# Patient Record
Sex: Female | Born: 1945 | Hispanic: No | Smoking: Never smoker
Health system: Southern US, Community
[De-identification: ages and names within clinical notes are randomized; demographics above are authoritative.]

## PROBLEM LIST (undated history)

## (undated) DIAGNOSIS — H3530 Unspecified macular degeneration: Secondary | ICD-10-CM

## (undated) DIAGNOSIS — R51 Headache: Secondary | ICD-10-CM

## (undated) DIAGNOSIS — M1990 Unspecified osteoarthritis, unspecified site: Secondary | ICD-10-CM

## (undated) DIAGNOSIS — R519 Headache, unspecified: Secondary | ICD-10-CM

## (undated) DIAGNOSIS — K219 Gastro-esophageal reflux disease without esophagitis: Secondary | ICD-10-CM

## (undated) HISTORY — PX: CATARACT EXTRACTION, BILATERAL: SHX1313

## (undated) HISTORY — PX: KNEE ARTHROSCOPY: SUR90

## (undated) HISTORY — PX: COLONOSCOPY: SHX174

## (undated) HISTORY — DX: Unspecified osteoarthritis, unspecified site: M19.90

## (undated) HISTORY — DX: Unspecified macular degeneration: H35.30

## (undated) HISTORY — PX: JOINT REPLACEMENT: SHX530

---

## 1981-03-16 HISTORY — PX: TUBAL LIGATION: SHX77

## 2011-12-16 ENCOUNTER — Other Ambulatory Visit (HOSPITAL_BASED_OUTPATIENT_CLINIC_OR_DEPARTMENT_OTHER): Payer: Self-pay | Admitting: Orthopaedic Surgery

## 2011-12-16 DIAGNOSIS — R52 Pain, unspecified: Secondary | ICD-10-CM

## 2011-12-16 DIAGNOSIS — R531 Weakness: Secondary | ICD-10-CM

## 2011-12-19 ENCOUNTER — Ambulatory Visit (HOSPITAL_BASED_OUTPATIENT_CLINIC_OR_DEPARTMENT_OTHER)
Admission: RE | Admit: 2011-12-19 | Discharge: 2011-12-19 | Disposition: A | Discharge status: Home / Self Care | Payer: Medicare Other | Source: Ambulatory Visit | Attending: Orthopaedic Surgery | Admitting: Orthopaedic Surgery

## 2011-12-19 DIAGNOSIS — X58XXXA Exposure to other specified factors, initial encounter: Secondary | ICD-10-CM | POA: Insufficient documentation

## 2011-12-19 DIAGNOSIS — M1710 Unilateral primary osteoarthritis, unspecified knee: Secondary | ICD-10-CM | POA: Insufficient documentation

## 2011-12-19 DIAGNOSIS — IMO0002 Reserved for concepts with insufficient information to code with codable children: Secondary | ICD-10-CM | POA: Insufficient documentation

## 2011-12-19 DIAGNOSIS — M6740 Ganglion, unspecified site: Secondary | ICD-10-CM | POA: Insufficient documentation

## 2011-12-19 DIAGNOSIS — R531 Weakness: Secondary | ICD-10-CM

## 2011-12-19 DIAGNOSIS — R52 Pain, unspecified: Secondary | ICD-10-CM

## 2013-10-11 ENCOUNTER — Ambulatory Visit: Payer: Medicare Other | Attending: Orthopaedic Surgery | Admitting: Rehabilitation

## 2013-10-11 DIAGNOSIS — R609 Edema, unspecified: Secondary | ICD-10-CM | POA: Insufficient documentation

## 2013-10-11 DIAGNOSIS — M25569 Pain in unspecified knee: Secondary | ICD-10-CM | POA: Diagnosis not present

## 2013-10-11 DIAGNOSIS — Z9889 Other specified postprocedural states: Secondary | ICD-10-CM | POA: Insufficient documentation

## 2013-10-11 DIAGNOSIS — IMO0001 Reserved for inherently not codable concepts without codable children: Secondary | ICD-10-CM | POA: Diagnosis present

## 2013-10-12 ENCOUNTER — Ambulatory Visit: Payer: Medicare Other | Admitting: Rehabilitation

## 2013-10-12 DIAGNOSIS — IMO0001 Reserved for inherently not codable concepts without codable children: Secondary | ICD-10-CM | POA: Diagnosis not present

## 2013-10-16 ENCOUNTER — Ambulatory Visit: Payer: Medicare Other | Attending: Orthopaedic Surgery | Admitting: Rehabilitation

## 2013-10-16 DIAGNOSIS — IMO0001 Reserved for inherently not codable concepts without codable children: Secondary | ICD-10-CM | POA: Insufficient documentation

## 2013-10-16 DIAGNOSIS — Z9889 Other specified postprocedural states: Secondary | ICD-10-CM | POA: Insufficient documentation

## 2013-10-16 DIAGNOSIS — R609 Edema, unspecified: Secondary | ICD-10-CM | POA: Diagnosis not present

## 2013-10-16 DIAGNOSIS — M25569 Pain in unspecified knee: Secondary | ICD-10-CM | POA: Diagnosis not present

## 2013-10-19 ENCOUNTER — Ambulatory Visit: Payer: Medicare Other | Admitting: Rehabilitation

## 2013-10-19 DIAGNOSIS — IMO0001 Reserved for inherently not codable concepts without codable children: Secondary | ICD-10-CM | POA: Diagnosis not present

## 2013-10-23 ENCOUNTER — Ambulatory Visit: Payer: Medicare Other | Admitting: Rehabilitation

## 2013-10-23 DIAGNOSIS — IMO0001 Reserved for inherently not codable concepts without codable children: Secondary | ICD-10-CM | POA: Diagnosis not present

## 2013-10-26 ENCOUNTER — Ambulatory Visit: Payer: Medicare Other | Admitting: Rehabilitation

## 2013-10-26 DIAGNOSIS — IMO0001 Reserved for inherently not codable concepts without codable children: Secondary | ICD-10-CM | POA: Diagnosis not present

## 2013-10-30 ENCOUNTER — Ambulatory Visit: Payer: Medicare Other | Admitting: Rehabilitation

## 2013-10-30 DIAGNOSIS — IMO0001 Reserved for inherently not codable concepts without codable children: Secondary | ICD-10-CM | POA: Diagnosis not present

## 2013-11-02 ENCOUNTER — Ambulatory Visit: Payer: Medicare Other | Admitting: Rehabilitation

## 2013-11-02 DIAGNOSIS — IMO0001 Reserved for inherently not codable concepts without codable children: Secondary | ICD-10-CM | POA: Diagnosis not present

## 2013-11-06 ENCOUNTER — Ambulatory Visit: Payer: Medicare Other | Admitting: Rehabilitation

## 2013-11-06 DIAGNOSIS — IMO0001 Reserved for inherently not codable concepts without codable children: Secondary | ICD-10-CM | POA: Diagnosis not present

## 2013-11-13 ENCOUNTER — Encounter: Payer: Medicare Other | Admitting: Rehabilitation

## 2013-11-14 ENCOUNTER — Ambulatory Visit: Payer: Medicare Other | Attending: Orthopaedic Surgery | Admitting: Rehabilitation

## 2013-11-14 DIAGNOSIS — IMO0001 Reserved for inherently not codable concepts without codable children: Secondary | ICD-10-CM | POA: Insufficient documentation

## 2013-11-14 DIAGNOSIS — Z9889 Other specified postprocedural states: Secondary | ICD-10-CM | POA: Insufficient documentation

## 2013-11-14 DIAGNOSIS — M25569 Pain in unspecified knee: Secondary | ICD-10-CM | POA: Diagnosis not present

## 2013-11-14 DIAGNOSIS — R609 Edema, unspecified: Secondary | ICD-10-CM | POA: Insufficient documentation

## 2016-10-28 ENCOUNTER — Ambulatory Visit (INDEPENDENT_AMBULATORY_CARE_PROVIDER_SITE_OTHER): Payer: Medicare Other

## 2016-10-28 ENCOUNTER — Ambulatory Visit (INDEPENDENT_AMBULATORY_CARE_PROVIDER_SITE_OTHER): Payer: Self-pay

## 2016-10-28 ENCOUNTER — Encounter (INDEPENDENT_AMBULATORY_CARE_PROVIDER_SITE_OTHER): Payer: Self-pay | Admitting: Orthopedic Surgery

## 2016-10-28 ENCOUNTER — Ambulatory Visit (INDEPENDENT_AMBULATORY_CARE_PROVIDER_SITE_OTHER): Payer: Medicare Other | Admitting: Orthopedic Surgery

## 2016-10-28 VITALS — BP 114/64 | HR 87 | Ht 66.0 in | Wt 165.0 lb

## 2016-10-28 DIAGNOSIS — M1711 Unilateral primary osteoarthritis, right knee: Secondary | ICD-10-CM | POA: Diagnosis not present

## 2016-10-28 DIAGNOSIS — M1712 Unilateral primary osteoarthritis, left knee: Secondary | ICD-10-CM

## 2016-10-28 DIAGNOSIS — M170 Bilateral primary osteoarthritis of knee: Secondary | ICD-10-CM

## 2016-10-28 MED ORDER — BUPIVACAINE HCL 0.5 % IJ SOLN
3.0000 mL | INTRAMUSCULAR | Status: AC | PRN
  Administered 2016-10-28: 3 mL via INTRA_ARTICULAR

## 2016-10-28 MED ORDER — LIDOCAINE HCL 1 % IJ SOLN
3.0000 mL | INTRAMUSCULAR | Status: AC | PRN
  Administered 2016-10-28: 3 mL

## 2016-10-28 MED ORDER — METHYLPREDNISOLONE ACETATE 40 MG/ML IJ SUSP
80.0000 mg | INTRAMUSCULAR | Status: AC | PRN
  Administered 2016-10-28: 80 mg

## 2016-10-28 NOTE — Progress Notes (Signed)
Office Visit Note   Patient: Stacey Bryant           Date of Birth: [birth year]           MRN: [id number] Visit Date: 10/28/2016              Requested by: Raynelle Jan., MD [address] [place], [place] [address] PCP: Raynelle Jan., MD   Assessment & Plan: Visit Diagnoses:  1. Bilateral primary osteoarthritis of knee     Plan:  #1: Corticosteroid injection was given to the right knee. Tolerated procedure well. #2: Follow back up prior to her trip to [place] for the corticosteroid injection to the left knee which is more symptomatic.  Follow-Up Instructions: Return in about 3 weeks (around 11/18/2016).   Orders:  Orders Placed This Encounter  Procedures  . Large Joint Injection/Arthrocentesis  . XR Knee Complete 4 Views Left  . XR Knee Complete 4 Views Right   No orders of the defined types were placed in this encounter.     Procedures: Large Joint Inj Date/Time: 10/28/2016 11:18 AM Performed by: Jacqualine Code D Authorized by: Jacqualine Code D   Consent Given by:  Patient Timeout: prior to procedure the correct patient, procedure, and site was verified   Indications:  Pain and joint swelling Location:  Knee Site:  R knee Prep: patient was prepped and draped in usual sterile fashion   Needle Size:  25 G Needle Length:  1.5 inches Approach:  Anteromedial Ultrasound Guidance: No   Fluoroscopic Guidance: No   Arthrogram: No   Medications:  80 mg methylPREDNISolone acetate 40 MG/ML; 3 mL bupivacaine 0.5 %; 3 mL lidocaine 1 % Aspiration Attempted: No   Patient tolerance:  Patient tolerated the procedure well with no immediate complications     Clinical Data: No additional findings.   Subjective: Chief Complaint  Patient presents with  . Right Knee - Pain    Says that it is the same Erin Hearing' thing that is always going on.  . Left Knee - Pain    Jonny Ruiz is a very pleasant 71 year old white female who is seen today for evaluation of both knees her pain is  the knees have been previously noted because of osteoarthritis. Her last visit was in June 2016 after series of 3 Euflexa injections to the left knee. She states she is actually done well with that however most recently though she is starting to have pain and discomfort in the knees left much greater than right. Her symptoms are a global and the more painful along the lateral aspect and posteriorly on the left. The right is much less painful and somewhat tolerable this time. She has a trip to [place] and to [place] for the next several months. She comes in today requesting corticosteroid injection.    Review of Systems  All other systems reviewed and are negative.    Objective: Vital Signs: BP 114/64 (BP Location: Left Arm, Patient Position: Sitting)   Pulse 87   Ht 5\' 6"  (1.676 m)   Wt 165 lb (74.8 kg)   BMI 26.63 kg/m   Physical Exam  Constitutional: She is oriented to person, place, and time. She appears well-developed and well-nourished.  HENT:  Head: Normocephalic and atraumatic.  Eyes: Pupils are equal, round, and reactive to light. EOM are normal.  Pulmonary/Chest: Effort normal.  Neurological: She is alert and oriented to person, place, and time.  Skin: Skin is warm and dry.  Psychiatric: She  has a normal mood and affect. Her behavior is normal. Judgment and thought content normal.    Ortho Exam  Today she has range of motion of both knees little less than full extension with flexion to about 120. She does have some patellofemoral crepitance bilaterally. Left knee is tender to palpation along the joint lines as well as in the posterior aspect of the knee. She does have a trace effusion noted. Right knee is much less tender to palpation. Trace effusion noted also. Bilateral valgus positioning of the knee.  Specialty Comments:  No specialty comments available.  Imaging: Xr Knee Complete 4 Views Left  Result Date: 10/28/2016 Three-view x-rays of the left knee reveals  joint space narrowing on the medial compartment. She still maintaining a joint space. She does have some para-articular spurring medially but these are small in comparison to the right. Lateral joint space is open but also has periarticular spurring noted. She does have patellofemoral marked degeneration of that joint. Some calcification superior to the patella on the lateral.  Xr Knee Complete 4 Views Right  Result Date: 10/28/2016 Three-view x-ray of the right knee reveals marked degenerative changes more lateral than medial. She has periarticular spurring certainly more on the lateral aspect of the tibia and femur. She also has changes noted in the medial compartment. Possible cyst in the intercondylar eminence area she does have significant patellofemoral right is also. Very large spurs on the sunrise view.    PMFS History: There are no active problems to display for this patient.  No past medical history on file.  No family history on file.  No past surgical history on file. Social History   Occupational History  . Not on file.   Social History Main Topics  . Smoking status: Never Smoker  . Smokeless tobacco: Never Used  . Alcohol use Not on file  . Drug use: Unknown  . Sexual activity: Not on file

## 2016-11-18 ENCOUNTER — Encounter (INDEPENDENT_AMBULATORY_CARE_PROVIDER_SITE_OTHER): Payer: Self-pay | Admitting: Orthopedic Surgery

## 2016-11-18 ENCOUNTER — Ambulatory Visit (INDEPENDENT_AMBULATORY_CARE_PROVIDER_SITE_OTHER): Payer: Medicare Other | Admitting: Orthopedic Surgery

## 2016-11-18 VITALS — BP 123/67 | HR 71 | Resp 14 | Ht 66.0 in | Wt 165.0 lb

## 2016-11-18 DIAGNOSIS — M1712 Unilateral primary osteoarthritis, left knee: Secondary | ICD-10-CM | POA: Diagnosis not present

## 2016-11-18 MED ORDER — BUPIVACAINE HCL 0.5 % IJ SOLN
3.0000 mL | INTRAMUSCULAR | Status: AC | PRN
  Administered 2016-11-18: 3 mL via INTRA_ARTICULAR

## 2016-11-18 MED ORDER — TRAMADOL HCL 50 MG PO TABS: 50.0000 mg | ORAL_TABLET | Freq: Four times a day (QID) | ORAL | 0 refills | Status: DC | PRN

## 2016-11-18 MED ORDER — METHYLPREDNISOLONE ACETATE 40 MG/ML IJ SUSP
80.0000 mg | INTRAMUSCULAR | Status: AC | PRN
  Administered 2016-11-18: 80 mg

## 2016-11-18 MED ORDER — LIDOCAINE HCL 1 % IJ SOLN
3.0000 mL | INTRAMUSCULAR | Status: AC | PRN
  Administered 2016-11-18: 3 mL

## 2016-11-18 NOTE — Progress Notes (Signed)
Office Visit Note   Patient: Stacey Bryant           Date of Birth: March 15, 1946           MRN: [id number] Visit Date: 11/18/2016              Requested by: Raynelle Jan., MD [address] [place], [place] [address] PCP: Raynelle Jan., MD   Assessment & Plan: Visit Diagnoses:  1. Unilateral primary osteoarthritis, left knee     Plan:  #1: Corticosteroid injection to the left knee #2: Tramadol prescription was written for her to take with her to [place] just in case she needs it.  Follow-Up Instructions: Return if symptoms worsen or fail to improve.   Orders:  No orders of the defined types were placed in this encounter.  Meds ordered this encounter  Medications  . traMADol (ULTRAM) 50 MG tablet    Sig: Take 1 tablet (50 mg total) by mouth every 6 (six) hours as needed.    Dispense:  20 tablet    Refill:  0    Order Specific Question:   Supervising Provider    Answer:   Valeria Batman [[id number]]      Procedures: Large Joint Inj Date/Time: 11/18/2016 11:04 AM Performed by: Jacqualine Code D Authorized by: Jacqualine Code D   Consent Given by:  Patient Timeout: prior to procedure the correct patient, procedure, and site was verified   Indications:  Pain and joint swelling Location:  Knee Site:  L knee Prep: patient was prepped and draped in usual sterile fashion   Needle Size:  25 G Needle Length:  1.5 inches Approach:  Anteromedial Ultrasound Guidance: No   Fluoroscopic Guidance: No   Arthrogram: No   Medications:  80 mg methylPREDNISolone acetate 40 MG/ML; 3 mL bupivacaine 0.5 %; 3 mL lidocaine 1 % Aspiration Attempted: No   Patient tolerance:  Patient tolerated the procedure well with no immediate complications     Clinical Data: No additional findings.   Subjective: Chief Complaint  Patient presents with  . Left Knee - Pain  . Knee Pain    Left knee pain x years, swelling more with activity, difficulty with stairs, popping, clicking, grinding  noises, arthroscopic surgery 2015 Dr. Cleophas Dunker, not diabetic    Stacey Bryant is a very pleasant 72 year old white female who is seen today for evaluation and follow-up of both knees. She has been noted previously evaluated for osteoarthritis of both knees. She was seen in June 2016 after series of 3 Euflexa injections to the left knee. She states she is actually done well with that however most recently though she is starting to have pain and discomfort in the knees and that time the left was much greater than right. Her symptoms are global and the more painful along the lateral aspect and posteriorly on the left. The right is much less painful and somewhat tolerable this time. She has a trip to [place] and to [place] for the next several months. I had previously injected the right knee and she comes in now for the left knee to be injected prior to her trip.    Review of Systems  All other systems reviewed and are negative.    Objective: Vital Signs: BP 123/67 (BP Location: Left Arm, Patient Position: Sitting, Cuff Size: Normal)   Pulse 71   Resp 14   Ht 5\' 6"  (1.676 m)   Wt 165 lb (74.8 kg)   BMI 26.63 kg/m  Physical Exam  Constitutional: She is oriented to person, place, and time. She appears well-developed and well-nourished.  HENT:  Head: Normocephalic and atraumatic.  Eyes: Pupils are equal, round, and reactive to light. EOM are normal.  Pulmonary/Chest: Effort normal.  Neurological: She is alert and oriented to person, place, and time.  Skin: Skin is warm and dry.  Psychiatric: She has a normal mood and affect. Her behavior is normal. Judgment and thought content normal.    Ortho Exam  Today she has range of motion of both knees little less than full extension with flexion to about 120. She does have some patellofemoral crepitance bilaterally. Left knee is tender to palpation along the joint lines as well as in the posterior aspect of the knee. She does have a trace effusion  noted. Right knee is benign at this time.  Specialty Comments:  No specialty comments available.  Imaging: No results found.   PMFS History: There are no active problems to display for this patient.  Past Medical History:  Diagnosis Date  . Arthritis     History reviewed. No pertinent family history.  Past Surgical History:  Procedure Laterality Date  . KNEE ARTHROSCOPY     Social History   Occupational History  . Not on file.   Social History Main Topics  . Smoking status: Never Smoker  . Smokeless tobacco: Never Used  . Alcohol use 0.6 oz/week    1 Glasses of wine per week  . Drug use: No  . Sexual activity: Not on file

## 2017-01-05 ENCOUNTER — Encounter (INDEPENDENT_AMBULATORY_CARE_PROVIDER_SITE_OTHER): Payer: Self-pay | Admitting: Orthopaedic Surgery

## 2017-01-05 ENCOUNTER — Ambulatory Visit (INDEPENDENT_AMBULATORY_CARE_PROVIDER_SITE_OTHER): Payer: Medicare Other | Admitting: Orthopaedic Surgery

## 2017-01-05 VITALS — BP 130/67 | HR 70 | Resp 14 | Ht 68.0 in | Wt 165.0 lb

## 2017-01-05 DIAGNOSIS — M170 Bilateral primary osteoarthritis of knee: Secondary | ICD-10-CM | POA: Diagnosis not present

## 2017-01-05 NOTE — Progress Notes (Signed)
Office Visit Note   Patient: Stacey Bryant           Date of Birth: [birth year]           MRN: [id number] Visit Date: 01/05/2017              Requested by: Raynelle JanSpry, Heather M., MD [address] [place], [place] [address] PCP: Raynelle JanSpry, Heather M., MD   Assessment & Plan: Visit Diagnoses:  1. Bilateral primary osteoarthritis of knee     Plan: Long discussion regarding treatment options. Stacey Bryant would like to proceed with a left total knee replacement. We discussed over about 45 minutes the surgery, rehabilitation, physical therapy and tensor complications. She lives alone she would need postop inpatient rehabilitation we have [place] [facility] [place]. She'll need a clearance form from her primary care physician, Dr. Carolyne FiscalSpry. She would like to consider surgery sometime in early January  Follow-Up Instructions: Return will schedule surgery left knee.   Orders:  No orders of the defined types were placed in this encounter.  No orders of the defined types were placed in this encounter.     Procedures: No procedures performed   Clinical Data: No additional findings.   Subjective: Chief Complaint  Patient presents with  . Left Knee - Pain, Edema    Stacey Bryant is a 71 y o that is here  For chronic Left knee pain. Left>right. Today she would liek to discuss surgery. She did a lot of walking on a trip to [place].  Has been having problem with the arthritis in both of her knees for "many years". She just recently was on a trip to [place] when she had an exacerbation of her pain or realizes that it's "time to have my knee replacement. She's had cortisone in the past. She has tried anti-inflammatory medicines. She's reached a point where the pain interferes with her activities of daily living typically with her left knee. Since of both of her knees were performed in the standing projection in August. She does have significant decrease in the medial compartment of her  left knee with increased varus, subchondral sclerosis medially and peripheral osteophytes cerebral osteophyte formation about the patellofemoral joint. Both knees films are consistent with end-stage osteoarthritis. She certainly more symptomatic on the left  HPI  Review of Systems  Constitutional: Negative for chills, fatigue and fever.  Eyes: Negative for itching.  Respiratory: Negative for chest tightness and shortness of breath.   Cardiovascular: Negative for chest pain, palpitations and leg swelling.  Gastrointestinal: Negative for blood in stool, constipation and diarrhea.  Endocrine: Negative for polyuria.  Genitourinary: Negative for dysuria.  Musculoskeletal: Positive for arthralgias. Negative for back pain, joint swelling, neck pain and neck stiffness.  Allergic/Immunologic: Negative for immunocompromised state.  Neurological: Negative for dizziness and numbness.  Hematological: Does not bruise/bleed easily.  Psychiatric/Behavioral: Positive for sleep disturbance. The patient is not nervous/anxious.      Objective: Vital Signs: BP 130/67   Pulse 70   Resp 14   Ht 5\' 8"  (1.727 m)   Wt 165 lb (74.8 kg)   BMI 25.09 kg/m   Physical Exam  Ortho Exam awake alert and oriented 3 comfortable sitting. Very pleasant. Lacks a few degrees to full left knee extension. Some hypertrophic changes around the knee with a very minimal effusion. Left knee was not hot red warm or swollen. Flexed over 110 without instability. No calf or popliteal pain. Good pulses distally no swelling  distally. Straight leg raise negative. Painless range of motion both hips  Specialty Comments:  No specialty comments available.  Imaging: No results found.   PMFS History: There are no active problems to display for this patient.  Past Medical History:  Diagnosis Date  . Arthritis     No family history on file.  Past Surgical History:  Procedure Laterality Date  . KNEE ARTHROSCOPY     Social  History   Occupational History  . Not on file.   Social History Main Topics  . Smoking status: Never Smoker  . Smokeless tobacco: Never Used  . Alcohol use 0.6 oz/week    1 Glasses of wine per week  . Drug use: No  . Sexual activity: Not on file

## 2017-03-17 ENCOUNTER — Ambulatory Visit (INDEPENDENT_AMBULATORY_CARE_PROVIDER_SITE_OTHER): Payer: Medicare Other

## 2017-03-17 ENCOUNTER — Encounter (INDEPENDENT_AMBULATORY_CARE_PROVIDER_SITE_OTHER): Payer: Self-pay | Admitting: Orthopedic Surgery

## 2017-03-17 ENCOUNTER — Ambulatory Visit (INDEPENDENT_AMBULATORY_CARE_PROVIDER_SITE_OTHER): Payer: Self-pay

## 2017-03-17 ENCOUNTER — Ambulatory Visit (INDEPENDENT_AMBULATORY_CARE_PROVIDER_SITE_OTHER): Payer: Medicare Other | Admitting: Orthopedic Surgery

## 2017-03-17 VITALS — BP 118/67 | HR 84 | Temp 97.8°F | Resp 16 | Ht 65.5 in | Wt 173.0 lb

## 2017-03-17 DIAGNOSIS — M1611 Unilateral primary osteoarthritis, right hip: Secondary | ICD-10-CM | POA: Diagnosis not present

## 2017-03-17 DIAGNOSIS — M25551 Pain in right hip: Secondary | ICD-10-CM | POA: Insufficient documentation

## 2017-03-17 DIAGNOSIS — M1712 Unilateral primary osteoarthritis, left knee: Secondary | ICD-10-CM | POA: Diagnosis not present

## 2017-03-17 NOTE — Progress Notes (Deleted)
   Office Visit Note   Patient: Stacey Bryant           Date of Birth: January 01, 1946           MRN: [id number] Visit Date: 03/17/2017              Requested by: Raynelle JanSpry, Heather M., MD [address] [place], [place] [address] PCP: Raynelle JanSpry, Heather M., MD   Assessment & Plan: Visit Diagnoses: No diagnosis found.  Plan: ***  Follow-Up Instructions: No Follow-up on file.   Orders:  No orders of the defined types were placed in this encounter.  No orders of the defined types were placed in this encounter.     Procedures: No procedures performed   Clinical Data: No additional findings.   Subjective: Chief Complaint  Patient presents with  . Left Knee - Pain  . Pre-op Exam    03/30/17 Left total knee replacment    HPI  Review of Systems  Constitutional: Positive for activity change.  HENT: Negative for trouble swallowing.   Eyes: Negative for pain.  Respiratory: Negative for shortness of breath.   Cardiovascular: Positive for leg swelling.  Gastrointestinal: Negative for constipation.  Endocrine: Negative for cold intolerance.  Genitourinary: Negative for difficulty urinating.  Musculoskeletal: Positive for joint swelling.  Skin: Negative for rash.  Allergic/Immunologic: Negative for food allergies.  Neurological: Negative for weakness.  Hematological: Does not bruise/bleed easily.  Psychiatric/Behavioral: Positive for sleep disturbance.     Objective: Vital Signs: BP (!) 144/61 (BP Location: Right Arm, Patient Position: Sitting, Cuff Size: Normal)   Pulse 82   Resp 16   Ht 5' 5.5" (1.664 m)   Wt 173 lb (78.5 kg)   BMI 28.35 kg/m   Physical Exam  Ortho Exam  Specialty Comments:  No specialty comments available.  Imaging: No results found.   PMFS History: There are no active problems to display for this patient.  Past Medical History:  Diagnosis Date  . Arthritis     History reviewed. No pertinent family history.  Past Surgical History:    Procedure Laterality Date  . KNEE ARTHROSCOPY     Social History   Occupational History  . Not on file  Tobacco Use  . Smoking status: Never Smoker  . Smokeless tobacco: Never Used  Substance and Sexual Activity  . Alcohol use: Yes    Alcohol/week: 0.6 oz    Types: 1 Glasses of wine per week  . Drug use: No  . Sexual activity: Not on file

## 2017-03-17 NOTE — H&P (Signed)
TOTAL KNEE ADMISSION H&P  Patient is being admitted for left total knee arthroplasty.  Subjective:  Chief Complaint:left knee pain.  HPI: Stacey Bryant, 72 y.o. female, has a history of pain and functional disability in the left knee due to arthritis and has failed non-surgical conservative treatments for greater than 12 weeks to includeNSAID's and/or analgesics, corticosteriod injections, viscosupplementation injections, flexibility and strengthening excercises and activity modification.  Onset of symptoms was gradual, starting 5 years ago with gradually worsening course since that time. The patient noted prior procedures on the knee to include  arthroscopy on the left knee(s).  Patient currently rates pain in the left knee(s) at 7 out of 10 with activity. Patient has night pain, worsening of pain with activity and weight bearing, pain that interferes with activities of daily living, pain with passive range of motion, crepitus and joint swelling.  Patient has evidence of subchondral cysts, subchondral sclerosis, periarticular osteophytes and joint space narrowing by imaging studies. There is no active infection.  There are no active problems to display for this patient.  Past Medical History:  Diagnosis Date  . Arthritis     Past Surgical History:  Procedure Laterality Date  . KNEE ARTHROSCOPY      No current facility-administered medications for this encounter.    Current Outpatient Medications  Medication Sig Dispense Refill Last Dose  . acetaminophen (TYLENOL) 650 MG CR tablet Take 650 mg by mouth.   Taking  . cholecalciferol (VITAMIN D) 1000 units tablet Take 1,000 Units by mouth daily.   Taking  . diclofenac sodium (VOLTAREN) 1 % GEL Apply 2 g topically.   Taking  . Polyethyl Glycol-Propyl Glycol (SYSTANE OP) Apply to eye.   Taking  . RESTASIS MULTIDOSE 0.05 % ophthalmic emulsion INSTILL 1 DROP INTO BOTH EYES TWICE A DAY AS DIRECTED  3 Not Taking   Allergies  Allergen Reactions  .  Penicillin G Rash    Social History   Tobacco Use  . Smoking status: Never Smoker  . Smokeless tobacco: Never Used  Substance Use Topics  . Alcohol use: Yes    Alcohol/week: 0.6 oz    Types: 1 Glasses of wine per week    No family history on file.  Review of Systems  Constitutional: Positive for activity change.  HENT: Negative for trouble swallowing.   Eyes: Negative for pain.  Respiratory: Negative for shortness of breath.   Cardiovascular: Positive for leg swelling.  Gastrointestinal: Negative for constipation.  Endocrine: Negative for cold intolerance.  Genitourinary: Negative for difficulty urinating.  Musculoskeletal: Positive for joint swelling.  Skin: Negative for rash.  Allergic/Immunologic: Negative for food allergies.  Neurological: Negative for weakness.  Hematological: Does not bruise/bleed easily.  Psychiatric/Behavioral: Positive for sleep disturbance.    Objective:  Physical Exam  Constitutional: She is oriented to person, place, and time. She appears well-developed and well-nourished.  HENT:  Head: Normocephalic.  Eyes: Conjunctivae and EOM are normal. Pupils are equal, round, and reactive to light.  Neck: Neck supple. No tracheal deviation present. No thyromegaly present.  Cardiovascular: Normal rate, regular rhythm, normal heart sounds and intact distal pulses.  No murmur heard. Respiratory: Effort normal. She has wheezes. She has no rales.  GI: Soft. Bowel sounds are normal. There is no tenderness.  Neurological: She is alert and oriented to person, place, and time.  Skin: Skin is warm and dry.  Psychiatric: She has a normal mood and affect. Her behavior is normal. Judgment and thought content normal.  Musculoskeletal: She  has a trace to 1+ effusion. No warmth varus deformity. Tender both medial and lateral joint line with more medial pain. Crepitance with range of motion. Ranges from about 5 shy of full extension to 115 of flexion. Calf is supple  nontender.  Vital signs in last 24 hours: Temp:  [97.8 F (36.6 C)] 97.8 F (36.6 C) (01/02 1014) Pulse Rate:  [84] 84 (01/02 1014) Resp:  [16] 16 (01/02 1014) BP: (118)/(67) 118/67 (01/02 1014) Weight:  [173 lb (78.5 kg)] 173 lb (78.5 kg) (01/02 1014)  Labs:   Estimated body mass index is 28.35 kg/m as calculated from the following:   Height as of 03/17/17: 5' 5.5" (1.664 m).   Weight as of 03/17/17: 173 lb (78.5 kg).   Imaging Review Plain radiographs demonstrate moderate degenerative joint disease of the left knee(s). The overall alignment ismild varus. The bone quality appears to be good for age and reported activity level.  Assessment/Plan:  End stage arthritis, left knee   The patient history, physical examination, clinical judgment of the provider and imaging studies are consistent with end stage degenerative joint disease of the left knee(s) and total knee arthroplasty is deemed medically necessary. The treatment options including medical management, injection therapy arthroscopy and arthroplasty were discussed at length. The risks and benefits of total knee arthroplasty were presented and reviewed. The risks due to aseptic loosening, infection, stiffness, patella tracking problems, thromboembolic complications and other imponderables were discussed. The patient acknowledged the explanation, agreed to proceed with the plan and consent was signed. Patient is being admitted for inpatient treatment for surgery, pain control, PT, OT, prophylactic antibiotics, VTE prophylaxis, progressive ambulation and ADL's and discharge planning. The patient is planning to be discharged to skilled nursing facility

## 2017-03-17 NOTE — Progress Notes (Addendum)
Subjective:  Chief Complaint:left knee pain and right hip pain  HPI: Stacey Bryant, 72 y.o. female, has a history of pain and functional disability in the left knee due to arthritis and has failed non-surgical conservative treatments for greater than 12 weeks to includeNSAID's and/or analgesics, corticosteriod injections, viscosupplementation injections, flexibility and strengthening excercises and activity modification.  Onset of symptoms was gradual, starting 5 years ago with gradually worsening course since that time. The patient noted prior procedures on the knee to include  arthroscopy on the left knee(s).  Patient currently rates pain in the left knee(s) at 7 out of 10 with activity. Patient has night pain, worsening of pain with activity and weight bearing, pain that interferes with activities of daily living, pain with passive range of motion, crepitus and joint swelling.  Patient has evidence of subchondral cysts, subchondral sclerosis, periarticular osteophytes and joint space narrowing by imaging studies. There is no active infection.  There are no active problems to display for this patient.  She also complains of significant pain in her right knee. She also has pain in the right hip with some pain that refers posteriorly. This is whenever she gets into certain positions with the hip.  Past Medical History:  Diagnosis Date  . Arthritis     Past Surgical History:  Procedure Laterality Date  . KNEE ARTHROSCOPY      No current facility-administered medications for this encounter.    Current Outpatient Medications  Medication Sig Dispense Refill Last Dose  . acetaminophen (TYLENOL) 650 MG CR tablet Take 650 mg by mouth.   Taking  . cholecalciferol (VITAMIN D) 1000 units tablet Take 1,000 Units by mouth daily.   Taking  . diclofenac sodium (VOLTAREN) 1 % GEL Apply 2 g topically.   Taking  . Polyethyl Glycol-Propyl Glycol (SYSTANE OP) Apply to eye.   Taking  . RESTASIS MULTIDOSE 0.05 %  ophthalmic emulsion INSTILL 1 DROP INTO BOTH EYES TWICE A DAY AS DIRECTED  3 Not Taking   Allergies  Allergen Reactions  . Penicillin G Rash    Social History   Tobacco Use  . Smoking status: Never Smoker  . Smokeless tobacco: Never Used  Substance Use Topics  . Alcohol use: Yes    Alcohol/week: 0.6 oz    Types: 1 Glasses of wine per week    No family history on file.  Review of Systems  Constitutional: Positive for activity change.  HENT: Negative for trouble swallowing.   Eyes: Negative for pain.  Respiratory: Negative for shortness of breath.   Cardiovascular: Positive for leg swelling.  Gastrointestinal: Negative for constipation.  Endocrine: Negative for cold intolerance.  Genitourinary: Negative for difficulty urinating.  Musculoskeletal: Positive for joint swelling.  Skin: Negative for rash.  Allergic/Immunologic: Negative for food allergies.  Neurological: Negative for weakness.  Hematological: Does not bruise/bleed easily.  Psychiatric/Behavioral: Positive for sleep disturbance.    Objective:  Physical Exam  Constitutional: She is oriented to person, place, and time. She appears well-developed and well-nourished.  HENT:  Head: Normocephalic.  Eyes: Conjunctivae and EOM are normal. Pupils are equal, round, and reactive to light.  Neck: Neck supple. No tracheal deviation present. No thyromegaly present.  Cardiovascular: Normal rate, regular rhythm, normal heart sounds and intact distal pulses.  No murmur heard. Respiratory: Effort normal. She has wheezes. She has no rales.  GI: Soft. Bowel sounds are normal. There is no tenderness.  Neurological: She is alert and oriented to person, place, and time.  Skin: Skin  is warm and dry.  Psychiatric: She has a normal mood and affect. Her behavior is normal. Judgment and thought content normal.  Musculoskeletal: She has a trace to 1+ effusion. No warmth varus deformity. Tender both medial and lateral joint line with more  medial pain. Crepitance with range of motion. Ranges from about 5 shy of full extension to 115 of flexion. Calf is supple nontender.  Her right hip does not have full internal and external rotation. She is very limited with internal rotation of the hip joint. This causes her significant pain and discomfort. Imperative to the left hip she is quite limited on the right.  She has decreased range of motion with internal and external rotation of the right hip  Vital signs in last 24 hours: Temp:  [97.8 F (36.6 C)] 97.8 F (36.6 C) (01/02 1014) Pulse Rate:  [84] 84 (01/02 1014) Resp:  [16] 16 (01/02 1014) BP: (118)/(67) 118/67 (01/02 1014) Weight:  [173 lb (78.5 kg)] 173 lb (78.5 kg) (01/02 1014)  Labs:   Estimated body mass index is 28.35 kg/m as calculated from the following:   Height as of 03/17/17: 5' 5.5" (1.664 m).   Weight as of 03/17/17: 173 lb (78.5 kg).   Imaging Review Plain radiographs demonstrate moderate degenerative joint disease of the left knee(s). The overall alignment ismild varus. The bone quality appears to be good for age and reported activity level.  X-rays today performed of the right hip and pelvis reveals significant degenerative arthritis of the right hip. There is periarticular spurring as well as joint space narrowing and cystic changes and subchondral sclerosing.    Assessment/Plan:  End stage arthritis, left knee  Osteoarthritis right hip  The patient history, physical examination, clinical judgment of the provider and imaging studies are consistent with end stage degenerative joint disease of the left knee(s) and total knee arthroplasty is deemed medically necessary. The treatment options including medical management, injection therapy arthroscopy and arthroplasty were discussed at length. The risks and benefits of total knee arthroplasty were presented and reviewed. The risks due to aseptic loosening, infection, stiffness, patella tracking problems,  thromboembolic complications and other imponderables were discussed. The patient acknowledged the explanation, agreed to proceed with the plan and consent was signed. Patient is being admitted for inpatient treatment for surgery, pain control, PT, OT, prophylactic antibiotics, VTE prophylaxis, progressive ambulation and ADL's and discharge planning. The patient is planning to be discharged to skilled nursing facility  We have also discussed in detail her right hip arthritis. We have gone over possibilities of corticosteroid injections as well as total hip replacement in the near future. I counseled her with her options at this time.  Problem list was reviewed in detail  Face-to-face time spent with patient was greater than 30 minutes.  Greater than 50% of the time was spent in counseling and coordination of care.

## 2017-03-23 ENCOUNTER — Encounter (HOSPITAL_COMMUNITY): Payer: Self-pay

## 2017-03-23 ENCOUNTER — Other Ambulatory Visit: Payer: Self-pay

## 2017-03-23 ENCOUNTER — Encounter (HOSPITAL_COMMUNITY)
Admission: RE | Admit: 2017-03-23 | Discharge: 2017-03-23 | Disposition: A | Discharge status: Home / Self Care | Payer: Medicare Other | Source: Ambulatory Visit | Attending: Orthopaedic Surgery | Admitting: Orthopaedic Surgery

## 2017-03-23 ENCOUNTER — Ambulatory Visit (HOSPITAL_COMMUNITY)
Admission: RE | Admit: 2017-03-23 | Discharge: 2017-03-23 | Disposition: A | Discharge status: Home / Self Care | Payer: Medicare Other | Source: Ambulatory Visit | Attending: Orthopedic Surgery | Admitting: Orthopedic Surgery

## 2017-03-23 DIAGNOSIS — Z01818 Encounter for other preprocedural examination: Secondary | ICD-10-CM | POA: Diagnosis present

## 2017-03-23 HISTORY — DX: Headache, unspecified: R51.9

## 2017-03-23 HISTORY — DX: Gastro-esophageal reflux disease without esophagitis: K21.9

## 2017-03-23 HISTORY — DX: Headache: R51

## 2017-03-23 LAB — URINALYSIS, ROUTINE W REFLEX MICROSCOPIC
Bacteria, UA: NONE SEEN
Bilirubin Urine: NEGATIVE
GLUCOSE, UA: NEGATIVE mg/dL
Ketones, ur: NEGATIVE mg/dL
Leukocytes, UA: NEGATIVE
Nitrite: NEGATIVE
PH: 6 (ref 5.0–8.0)
Protein, ur: NEGATIVE mg/dL
SPECIFIC GRAVITY, URINE: 1.005 (ref 1.005–1.030)

## 2017-03-23 LAB — COMPREHENSIVE METABOLIC PANEL
ALBUMIN: 4.1 g/dL (ref 3.5–5.0)
ALK PHOS: 55 U/L (ref 38–126)
ALT: 14 U/L (ref 14–54)
AST: 19 U/L (ref 15–41)
Anion gap: 8 (ref 5–15)
BUN: 13 mg/dL (ref 6–20)
CALCIUM: 9.7 mg/dL (ref 8.9–10.3)
CHLORIDE: 103 mmol/L (ref 101–111)
CO2: 27 mmol/L (ref 22–32)
Creatinine, Ser: 0.72 mg/dL (ref 0.44–1.00)
GFR calc Af Amer: >60 mL/min (ref >60)
GFR calc non Af Amer: >60 mL/min (ref >60)
GLUCOSE: 101 mg/dL — AB (ref 65–99)
Potassium: 3.7 mmol/L (ref 3.5–5.1)
SODIUM: 138 mmol/L (ref 135–145)
Total Bilirubin: 0.7 mg/dL (ref 0.3–1.2)
Total Protein: 7.4 g/dL (ref 6.5–8.1)

## 2017-03-23 LAB — CBC WITH DIFFERENTIAL/PLATELET
BASOS PCT: 1 %
Basophils Absolute: 0.1 10*3/uL (ref 0.0–0.1)
EOS ABS: 0.1 10*3/uL (ref 0.0–0.7)
Eosinophils Relative: 1 %
HCT: 39.5 % (ref 36.0–46.0)
HEMOGLOBIN: 12.6 g/dL (ref 12.0–15.0)
Lymphocytes Relative: 34 %
Lymphs Abs: 2.4 10*3/uL (ref 0.7–4.0)
MCH: 27.4 pg (ref 26.0–34.0)
MCHC: 31.9 g/dL (ref 30.0–36.0)
MCV: 85.9 fL (ref 78.0–100.0)
MONOS PCT: 9 %
Monocytes Absolute: 0.7 10*3/uL (ref 0.1–1.0)
NEUTROS PCT: 55 %
Neutro Abs: 3.9 10*3/uL (ref 1.7–7.7)
Platelets: 308 10*3/uL (ref 150–400)
RBC: 4.6 MIL/uL (ref 3.87–5.11)
RDW: 13.5 % (ref 11.5–15.5)
WBC: 7.1 10*3/uL (ref 4.0–10.5)

## 2017-03-23 LAB — PROTIME-INR
INR: 1.11
Prothrombin Time: 14.2 seconds (ref 11.4–15.2)

## 2017-03-23 LAB — TYPE AND SCREEN
ABO/RH(D): A POS
ANTIBODY SCREEN: NEGATIVE

## 2017-03-23 LAB — SURGICAL PCR SCREEN
MRSA, PCR: NEGATIVE
Staphylococcus aureus: POSITIVE — AB

## 2017-03-23 LAB — ABO/RH: ABO/RH(D): A POS

## 2017-03-23 LAB — APTT: aPTT: 36 seconds (ref 24–36)

## 2017-03-23 NOTE — Pre-Procedure Instructions (Signed)
Stacey Bryant  03/23/2017      [facility] [place], [place] - [address] [place] [id number] Phone: [phone] Fax: [phone]    Your procedure is scheduled on Tuesday 03/30/17.  [facility] at 1100 A.M.  Call this number if you have problems the morning of surgery:  [phone]   Remember:  Do not eat food or drink liquids after midnight.  Take these medicines the morning of surgery with A SIP OF WATER - NONE  7 days prior to surgery STOP taking any Aspirin(unless otherwise instructed by your surgeon), Aleve, Naproxen, Ibuprofen, Motrin, Advil, Goody's, BC's, all herbal medications, fish oil, and all vitamins   Do not wear jewelry, make-up or nail polish.  Do not wear lotions, powders, or perfumes, or deodorant.  Do not shave 48 hours prior to surgery.  Men may shave face and neck.  Do not bring valuables to the hospital.  [facility] is not responsible for any belongings or valuables.  Contacts, dentures or bridgework may not be worn into surgery.  Leave your suitcase in the car.  After surgery it may be brought to your room.  For patients admitted to the hospital, discharge time will be determined by your treatment team.  Patients discharged the day of surgery will not be allowed to drive home.   Name and phone number of your driver:    Special instructions:   - Preparing for Surgery  Before surgery, you can play an important role.  Because skin is not sterile, your skin needs to be as free of germs as possible.  You can reduce the number of germs on you skin by washing with CHG (chlorahexidine gluconate) soap before surgery.  CHG is an antiseptic cleaner which kills germs and bonds with the skin to continue killing germs even after washing.  Please DO NOT use if you have an allergy to CHG or antibacterial soaps.  If your skin becomes reddened/irritated stop using the CHG and inform your nurse  when you arrive at Short Stay.  Do not shave (including legs and underarms) for at least 48 hours prior to the first CHG shower.  You may shave your face.  Please follow these instructions carefully:   1.  Shower with CHG Soap the night before surgery and the                                morning of Surgery.  2.  If you choose to wash your hair, wash your hair first as usual with your       normal shampoo.  3.  After you shampoo, rinse your hair and body thoroughly to remove the                      Shampoo.  4.  Use CHG as you would any other liquid soap.  You can apply chg directly       to the skin and wash gently with scrungie or a clean washcloth.  5.  Apply the CHG Soap to your body ONLY FROM THE NECK DOWN.        Do not use on open wounds or open sores.  Avoid contact with your eyes,       ears, mouth and genitals (private parts).  Wash genitals (private parts)  with your normal soap.  6.  Wash thoroughly, paying special attention to the area where your surgery        will be performed.  7.  Thoroughly rinse your body with warm water from the neck down.  8.  DO NOT shower/wash with your normal soap after using and rinsing off       the CHG Soap.  9.  Pat yourself dry with a clean towel.            10.  Wear clean pajamas.            11.  Place clean sheets on your bed the night of your first shower and do not        sleep with pets.  Day of Surgery  Do not apply any lotions/deoderants the morning of surgery.  Please wear clean clothes to the hospital/surgery center.    Please read over the following fact sheets that you were given. MRSA Information and Surgical Site Infection Prevention

## 2017-03-23 NOTE — Progress Notes (Signed)
Mupirocin [facility]intment [facility] [place] for positive PCR of Staph. Pt notified and voiced understanding.

## 2017-03-23 NOTE — Progress Notes (Signed)
REQUESTED EKG FROM PCP DR. SPRY.

## 2017-03-24 LAB — URINE CULTURE: Culture: <10000 — AB

## 2017-03-29 ENCOUNTER — Telehealth (INDEPENDENT_AMBULATORY_CARE_PROVIDER_SITE_OTHER): Payer: Self-pay | Admitting: Orthopaedic Surgery

## 2017-03-29 MED ORDER — ACETAMINOPHEN 10 MG/ML IV SOLN
1000.0000 mg | INTRAVENOUS | Status: AC
  Administered 2017-03-30: 1000 mg via INTRAVENOUS

## 2017-03-29 MED ORDER — TRANEXAMIC ACID 1000 MG/10ML IV SOLN
2000.0000 mg | INTRAVENOUS | Status: DC
  Filled 2017-03-29: qty 20

## 2017-03-29 MED ORDER — SODIUM CHLORIDE 0.9 % IV SOLN: INTRAVENOUS | Status: DC

## 2017-03-29 NOTE — Telephone Encounter (Signed)
Kathlene NovemberMike from [facility] called to request prior authorization for patient (date of surgery is 03/30/17)  Please return his call at [phone]

## 2017-03-29 NOTE — Telephone Encounter (Signed)
WHo does this go to?

## 2017-03-29 NOTE — Progress Notes (Signed)
Message left for EKG tracing and last office note to be faxed  over.

## 2017-03-30 ENCOUNTER — Other Ambulatory Visit: Payer: Self-pay

## 2017-03-30 ENCOUNTER — Inpatient Hospital Stay (HOSPITAL_COMMUNITY)
Admission: RE | Admit: 2017-03-30 | Discharge: 2017-04-01 | DRG: 470 | Disposition: A | Discharge status: Skilled Nursing Facility | Payer: Medicare Other | Source: Ambulatory Visit | Attending: Orthopaedic Surgery | Admitting: Orthopaedic Surgery

## 2017-03-30 ENCOUNTER — Encounter (HOSPITAL_COMMUNITY)
Admission: RE | Disposition: A | Discharge status: Skilled Nursing Facility | Payer: Self-pay | Source: Ambulatory Visit | Attending: Orthopaedic Surgery

## 2017-03-30 ENCOUNTER — Inpatient Hospital Stay (HOSPITAL_COMMUNITY): Payer: Medicare Other | Admitting: Emergency Medicine

## 2017-03-30 ENCOUNTER — Encounter (HOSPITAL_COMMUNITY): Payer: Self-pay | Admitting: General Practice

## 2017-03-30 ENCOUNTER — Inpatient Hospital Stay (HOSPITAL_COMMUNITY): Payer: Medicare Other | Admitting: Anesthesiology

## 2017-03-30 DIAGNOSIS — M25562 Pain in left knee: Secondary | ICD-10-CM | POA: Diagnosis present

## 2017-03-30 DIAGNOSIS — M25762 Osteophyte, left knee: Secondary | ICD-10-CM | POA: Diagnosis present

## 2017-03-30 DIAGNOSIS — Z880 Allergy status to penicillin: Secondary | ICD-10-CM | POA: Diagnosis not present

## 2017-03-30 DIAGNOSIS — M1712 Unilateral primary osteoarthritis, left knee: Principal | ICD-10-CM | POA: Diagnosis present

## 2017-03-30 DIAGNOSIS — K219 Gastro-esophageal reflux disease without esophagitis: Secondary | ICD-10-CM | POA: Diagnosis present

## 2017-03-30 DIAGNOSIS — Z79899 Other long term (current) drug therapy: Secondary | ICD-10-CM

## 2017-03-30 DIAGNOSIS — D62 Acute posthemorrhagic anemia: Secondary | ICD-10-CM | POA: Diagnosis not present

## 2017-03-30 DIAGNOSIS — Z96652 Presence of left artificial knee joint: Secondary | ICD-10-CM

## 2017-03-30 HISTORY — PX: TOTAL KNEE ARTHROPLASTY: SHX125

## 2017-03-30 SURGERY — ARTHROPLASTY, KNEE, TOTAL
Anesthesia: Regional | Site: Knee | Laterality: Left

## 2017-03-30 MED ORDER — OXYCODONE HCL 5 MG PO TABS
5.0000 mg | ORAL_TABLET | ORAL | Status: DC | PRN
  Administered 2017-03-30 – 2017-04-01 (×6): 5 mg via ORAL
  Filled 2017-03-30 (×11): qty 1

## 2017-03-30 MED ORDER — PHENYLEPHRINE HCL 10 MG/ML IJ SOLN
INTRAVENOUS | Status: DC | PRN
  Administered 2017-03-30: 40 ug/min via INTRAVENOUS

## 2017-03-30 MED ORDER — PROPOFOL 500 MG/50ML IV EMUL
INTRAVENOUS | Status: DC | PRN
  Administered 2017-03-30: 85 ug/kg/min via INTRAVENOUS

## 2017-03-30 MED ORDER — ONDANSETRON HCL 4 MG/2ML IJ SOLN
INTRAMUSCULAR | Status: AC
  Filled 2017-03-30: qty 2

## 2017-03-30 MED ORDER — METHOCARBAMOL 1000 MG/10ML IJ SOLN
500.0000 mg | Freq: Four times a day (QID) | INTRAMUSCULAR | Status: DC | PRN
  Filled 2017-03-30: qty 5

## 2017-03-30 MED ORDER — OXYCODONE HCL 5 MG PO TABS
ORAL_TABLET | ORAL | Status: AC
  Filled 2017-03-30: qty 1

## 2017-03-30 MED ORDER — BUPIVACAINE-EPINEPHRINE (PF) 0.5% -1:200000 IJ SOLN
INTRAMUSCULAR | Status: AC
  Filled 2017-03-30: qty 30

## 2017-03-30 MED ORDER — METOCLOPRAMIDE HCL 5 MG PO TABS: 5.0000 mg | ORAL_TABLET | Freq: Three times a day (TID) | ORAL | Status: DC | PRN

## 2017-03-30 MED ORDER — BISACODYL 10 MG RE SUPP: 10.0000 mg | Freq: Every day | RECTAL | Status: DC | PRN

## 2017-03-30 MED ORDER — CEFAZOLIN SODIUM-DEXTROSE 2-4 GM/100ML-% IV SOLN
2.0000 g | INTRAVENOUS | Status: AC
  Administered 2017-03-30: 2 g via INTRAVENOUS
  Filled 2017-03-30: qty 100

## 2017-03-30 MED ORDER — RIVAROXABAN 10 MG PO TABS
10.0000 mg | ORAL_TABLET | Freq: Every day | ORAL | Status: DC
  Administered 2017-03-31 – 2017-04-01 (×2): 10 mg via ORAL
  Filled 2017-03-30 (×2): qty 1

## 2017-03-30 MED ORDER — CHLORHEXIDINE GLUCONATE 4 % EX LIQD: 60.0000 mL | Freq: Once | CUTANEOUS | Status: DC

## 2017-03-30 MED ORDER — ACETAMINOPHEN 10 MG/ML IV SOLN
1000.0000 mg | Freq: Four times a day (QID) | INTRAVENOUS | Status: AC
  Administered 2017-03-30 – 2017-03-31 (×4): 1000 mg via INTRAVENOUS
  Filled 2017-03-30 (×4): qty 100

## 2017-03-30 MED ORDER — POLYVINYL ALCOHOL 1.4 % OP SOLN
1.0000 [drp] | Freq: Two times a day (BID) | OPHTHALMIC | Status: DC
  Administered 2017-03-30 – 2017-04-01 (×4): 1 [drp] via OPHTHALMIC
  Filled 2017-03-30: qty 15

## 2017-03-30 MED ORDER — KETOROLAC TROMETHAMINE 15 MG/ML IJ SOLN
7.5000 mg | Freq: Four times a day (QID) | INTRAMUSCULAR | Status: AC
  Administered 2017-03-30 – 2017-03-31 (×4): 7.5 mg via INTRAVENOUS
  Filled 2017-03-30 (×3): qty 1

## 2017-03-30 MED ORDER — MIDAZOLAM HCL 2 MG/2ML IJ SOLN
INTRAMUSCULAR | Status: AC
  Administered 2017-03-30: 1 mg
  Filled 2017-03-30: qty 2

## 2017-03-30 MED ORDER — CEFAZOLIN SODIUM-DEXTROSE 2-4 GM/100ML-% IV SOLN
2.0000 g | Freq: Four times a day (QID) | INTRAVENOUS | Status: AC
  Administered 2017-03-30 – 2017-03-31 (×2): 2 g via INTRAVENOUS
  Filled 2017-03-30 (×2): qty 100

## 2017-03-30 MED ORDER — FENTANYL CITRATE (PF) 100 MCG/2ML IJ SOLN
INTRAMUSCULAR | Status: AC
  Administered 2017-03-30: 50 ug
  Filled 2017-03-30: qty 2

## 2017-03-30 MED ORDER — METHOCARBAMOL 500 MG PO TABS
500.0000 mg | ORAL_TABLET | Freq: Four times a day (QID) | ORAL | Status: DC | PRN
  Administered 2017-03-30 – 2017-04-01 (×4): 500 mg via ORAL
  Filled 2017-03-30 (×3): qty 1

## 2017-03-30 MED ORDER — HYDROMORPHONE HCL 1 MG/ML IJ SOLN: 0.5000 mg | INTRAMUSCULAR | Status: DC | PRN

## 2017-03-30 MED ORDER — PHENOL 1.4 % MT LIQD: 1.0000 | OROMUCOSAL | Status: DC | PRN

## 2017-03-30 MED ORDER — FENTANYL CITRATE (PF) 250 MCG/5ML IJ SOLN
INTRAMUSCULAR | Status: AC
  Filled 2017-03-30: qty 5

## 2017-03-30 MED ORDER — PROPOFOL 10 MG/ML IV BOLUS
INTRAVENOUS | Status: DC | PRN
  Administered 2017-03-30: 20 mg via INTRAVENOUS

## 2017-03-30 MED ORDER — DEXAMETHASONE SODIUM PHOSPHATE 10 MG/ML IJ SOLN
INTRAMUSCULAR | Status: DC | PRN
  Administered 2017-03-30: 5 mg via INTRAVENOUS

## 2017-03-30 MED ORDER — DOCUSATE SODIUM 100 MG PO CAPS
100.0000 mg | ORAL_CAPSULE | Freq: Two times a day (BID) | ORAL | Status: DC
  Administered 2017-03-30 – 2017-04-01 (×4): 100 mg via ORAL
  Filled 2017-03-30 (×4): qty 1

## 2017-03-30 MED ORDER — METOCLOPRAMIDE HCL 5 MG/ML IJ SOLN: 5.0000 mg | Freq: Three times a day (TID) | INTRAMUSCULAR | Status: DC | PRN

## 2017-03-30 MED ORDER — OXYCODONE HCL 5 MG PO TABS
5.0000 mg | ORAL_TABLET | ORAL | Status: DC | PRN
  Administered 2017-03-30 – 2017-03-31 (×6): 5 mg via ORAL

## 2017-03-30 MED ORDER — FENTANYL CITRATE (PF) 250 MCG/5ML IJ SOLN
INTRAMUSCULAR | Status: DC | PRN
  Administered 2017-03-30 (×2): 50 ug via INTRAVENOUS

## 2017-03-30 MED ORDER — ALUM & MAG HYDROXIDE-SIMETH 200-200-20 MG/5ML PO SUSP: 30.0000 mL | ORAL | Status: DC | PRN

## 2017-03-30 MED ORDER — ROPIVACAINE HCL 5 MG/ML IJ SOLN
INTRAMUSCULAR | Status: DC | PRN
  Administered 2017-03-30: 30 mL via PERINEURAL

## 2017-03-30 MED ORDER — LACTATED RINGERS IV SOLN
INTRAVENOUS | Status: DC
  Administered 2017-03-30 (×2): via INTRAVENOUS

## 2017-03-30 MED ORDER — MAGNESIUM CITRATE PO SOLN: 1.0000 | Freq: Once | ORAL | Status: DC | PRN

## 2017-03-30 MED ORDER — ONDANSETRON HCL 4 MG PO TABS
4.0000 mg | ORAL_TABLET | Freq: Four times a day (QID) | ORAL | Status: DC | PRN
  Filled 2017-03-30: qty 1

## 2017-03-30 MED ORDER — KETOROLAC TROMETHAMINE 15 MG/ML IJ SOLN
INTRAMUSCULAR | Status: AC
  Filled 2017-03-30: qty 1

## 2017-03-30 MED ORDER — SODIUM CHLORIDE 0.9 % IV SOLN
INTRAVENOUS | Status: DC
  Administered 2017-03-31: 05:00:00 via INTRAVENOUS

## 2017-03-30 MED ORDER — SODIUM CHLORIDE 0.9 % IR SOLN
Status: DC | PRN
  Administered 2017-03-30: 1
  Administered 2017-03-30: 3000 mL

## 2017-03-30 MED ORDER — METHOCARBAMOL 500 MG PO TABS
ORAL_TABLET | ORAL | Status: AC
  Filled 2017-03-30: qty 1

## 2017-03-30 MED ORDER — ONDANSETRON HCL 4 MG/2ML IJ SOLN: 4.0000 mg | Freq: Four times a day (QID) | INTRAMUSCULAR | Status: DC | PRN

## 2017-03-30 MED ORDER — DEXAMETHASONE SODIUM PHOSPHATE 10 MG/ML IJ SOLN
INTRAMUSCULAR | Status: AC
  Filled 2017-03-30: qty 1

## 2017-03-30 MED ORDER — POLYETHYLENE GLYCOL 3350 17 G PO PACK: 17.0000 g | PACK | Freq: Every day | ORAL | Status: DC | PRN

## 2017-03-30 MED ORDER — POLYETHYL GLYCOL-PROPYL GLYCOL 0.4-0.3 % OP GEL: Freq: Two times a day (BID) | OPHTHALMIC | Status: DC

## 2017-03-30 MED ORDER — ACETAMINOPHEN 10 MG/ML IV SOLN
INTRAVENOUS | Status: AC
  Filled 2017-03-30: qty 100

## 2017-03-30 MED ORDER — BUPIVACAINE-EPINEPHRINE (PF) 0.5% -1:200000 IJ SOLN
INTRAMUSCULAR | Status: DC | PRN
  Administered 2017-03-30: 30 mL

## 2017-03-30 MED ORDER — DIPHENHYDRAMINE HCL 12.5 MG/5ML PO ELIX: 12.5000 mg | ORAL_SOLUTION | ORAL | Status: DC | PRN

## 2017-03-30 MED ORDER — MENTHOL 3 MG MT LOZG: 1.0000 | LOZENGE | OROMUCOSAL | Status: DC | PRN

## 2017-03-30 MED ORDER — PROPOFOL 10 MG/ML IV BOLUS
INTRAVENOUS | Status: AC
  Filled 2017-03-30: qty 20

## 2017-03-30 MED ORDER — BUPIVACAINE IN DEXTROSE 0.75-8.25 % IT SOLN
INTRATHECAL | Status: DC | PRN
  Administered 2017-03-30: 1.7 mL via INTRATHECAL

## 2017-03-30 MED ORDER — VITAMIN D 1000 UNITS PO TABS
1000.0000 [IU] | ORAL_TABLET | Freq: Every day | ORAL | Status: DC
  Administered 2017-03-31 – 2017-04-01 (×2): 1000 [IU] via ORAL
  Filled 2017-03-30 (×2): qty 1

## 2017-03-30 SURGICAL SUPPLY — 57 items
BAG DECANTER FOR FLEXI CONT (MISCELLANEOUS) ×3 IMPLANT
BANDAGE ESMARK 6X9 LF (GAUZE/BANDAGES/DRESSINGS) ×1 IMPLANT
BLADE SAGITTAL 25.0X1.19X90 (BLADE) ×2 IMPLANT
BLADE SAGITTAL 25.0X1.19X90MM (BLADE) ×1
BNDG ESMARK 6X9 LF (GAUZE/BANDAGES/DRESSINGS) ×3
BOWL SMART MIX CTS (DISPOSABLE) ×3 IMPLANT
CAP KNEE TOTAL 3 SIGMA ×3 IMPLANT
CEMENT HV SMART SET (Cement) ×6 IMPLANT
COVER SURGICAL LIGHT HANDLE (MISCELLANEOUS) ×3 IMPLANT
CUFF TOURNIQUET SINGLE 34IN LL (TOURNIQUET CUFF) ×3 IMPLANT
DECANTER SPIKE VIAL GLASS SM (MISCELLANEOUS) ×3 IMPLANT
DRAPE EXTREMITY T 121X128X90 (DRAPE) ×3 IMPLANT
DRAPE HALF SHEET 40X57 (DRAPES) ×3 IMPLANT
DRSG ADAPTIC 3X8 NADH LF (GAUZE/BANDAGES/DRESSINGS) ×3 IMPLANT
DRSG PAD ABDOMINAL 8X10 ST (GAUZE/BANDAGES/DRESSINGS) ×6 IMPLANT
DURAPREP 26ML APPLICATOR (WOUND CARE) ×6 IMPLANT
ELECT CAUTERY BLADE 6.4 (BLADE) ×3 IMPLANT
ELECT REM PT RETURN 9FT ADLT (ELECTROSURGICAL) ×3
ELECTRODE REM PT RTRN 9FT ADLT (ELECTROSURGICAL) ×1 IMPLANT
EVACUATOR 1/8 PVC DRAIN (DRAIN) IMPLANT
FACESHIELD WRAPAROUND (MASK) ×6 IMPLANT
GAUZE SPONGE 4X4 12PLY STRL (GAUZE/BANDAGES/DRESSINGS) ×3 IMPLANT
GLOVE BIOGEL PI IND STRL 6.5 (GLOVE) ×3 IMPLANT
GLOVE BIOGEL PI IND STRL 8 (GLOVE) ×1 IMPLANT
GLOVE BIOGEL PI IND STRL 8.5 (GLOVE) ×1 IMPLANT
GLOVE BIOGEL PI INDICATOR 6.5 (GLOVE) ×6
GLOVE BIOGEL PI INDICATOR 8 (GLOVE) ×2
GLOVE BIOGEL PI INDICATOR 8.5 (GLOVE) ×2
GLOVE ECLIPSE 8.0 STRL XLNG CF (GLOVE) ×6 IMPLANT
GLOVE ECLIPSE 8.5 STRL (GLOVE) ×6 IMPLANT
GLOVE SURG SS PI 6.5 STRL IVOR (GLOVE) ×9 IMPLANT
GOWN STRL REUS W/ TWL LRG LVL3 (GOWN DISPOSABLE) ×2 IMPLANT
GOWN STRL REUS W/TWL 2XL LVL3 (GOWN DISPOSABLE) ×3 IMPLANT
GOWN STRL REUS W/TWL LRG LVL3 (GOWN DISPOSABLE) ×4
HANDPIECE INTERPULSE COAX TIP (DISPOSABLE) ×2
KIT BASIN OR (CUSTOM PROCEDURE TRAY) ×3 IMPLANT
KIT ROOM TURNOVER OR (KITS) ×3 IMPLANT
MANIFOLD NEPTUNE II (INSTRUMENTS) ×3 IMPLANT
NEEDLE 22X1 1/2 (OR ONLY) (NEEDLE) ×3 IMPLANT
NS IRRIG 1000ML POUR BTL (IV SOLUTION) ×3 IMPLANT
PACK TOTAL JOINT (CUSTOM PROCEDURE TRAY) ×3 IMPLANT
PAD ARMBOARD 7.5X6 YLW CONV (MISCELLANEOUS) ×6 IMPLANT
PAD CAST 4YDX4 CTTN HI CHSV (CAST SUPPLIES) ×1 IMPLANT
PADDING CAST COTTON 4X4 STRL (CAST SUPPLIES) ×2
PADDING CAST COTTON 6X4 STRL (CAST SUPPLIES) ×3 IMPLANT
SET HNDPC FAN SPRY TIP SCT (DISPOSABLE) ×1 IMPLANT
STAPLER VISISTAT 35W (STAPLE) ×3 IMPLANT
SUT BONE WAX W31G (SUTURE) ×3 IMPLANT
SUT ETHIBOND NAB CT1 #1 30IN (SUTURE) ×6 IMPLANT
SUT MNCRL AB 3-0 PS2 18 (SUTURE) ×3 IMPLANT
SUT VIC AB 0 CT1 27 (SUTURE) ×2
SUT VIC AB 0 CT1 27XBRD ANBCTR (SUTURE) ×1 IMPLANT
SYR CONTROL 10ML LL (SYRINGE) IMPLANT
TOWEL OR 17X24 6PK STRL BLUE (TOWEL DISPOSABLE) ×3 IMPLANT
TOWEL OR 17X26 10 PK STRL BLUE (TOWEL DISPOSABLE) ×3 IMPLANT
TRAY FOLEY CATH SILVER 14FR (SET/KITS/TRAYS/PACK) ×3 IMPLANT
WRAP KNEE MAXI GEL POST OP (GAUZE/BANDAGES/DRESSINGS) ×3 IMPLANT

## 2017-03-30 NOTE — Progress Notes (Signed)
Orthopedic Tech Progress Note Patient Details:  Algis LimingJoan Hilyer [birth year] [id number]  CPM Left Knee CPM Left Knee: On Left Knee Flexion (Degrees): 90 Left Knee Extension (Degrees): 0  Post Interventions Patient Tolerated: Well Instructions Provided: Care of device  Jennye MoccasinHughes, Hafiz Irion Craig 03/30/2017, 4:05 PM

## 2017-03-30 NOTE — Anesthesia Preprocedure Evaluation (Addendum)
Anesthesia Evaluation  Patient identified by MRN, date of birth, ID band Patient awake    Reviewed: Allergy & Precautions, NPO status , Patient's Chart, lab work & pertinent test results  Airway Mallampati: I       Dental no notable dental hx. (+) Teeth Intact   Pulmonary neg pulmonary ROS,    Pulmonary exam normal breath sounds clear to auscultation       Cardiovascular negative cardio ROS Normal cardiovascular exam Rhythm:Regular Rate:Normal     Neuro/Psych negative neurological ROS  negative psych ROS   GI/Hepatic negative GI ROS, Neg liver ROS,   Endo/Other  negative endocrine ROS  Renal/GU negative Renal ROS  negative genitourinary   Musculoskeletal negative musculoskeletal ROS (+)   Abdominal Normal abdominal exam  (+)   Peds  Hematology negative hematology ROS (+)   Anesthesia Other Findings   Reproductive/Obstetrics                            Lab Results  Component Value Date   INR 1.11 03/23/2017   Lab Results  Component Value Date   WBC 7.1 03/23/2017   HGB 12.6 03/23/2017   HCT 39.5 03/23/2017   MCV 85.9 03/23/2017   PLT 308 03/23/2017   Lab Results  Component Value Date   CREATININE 0.72 03/23/2017   BUN 13 03/23/2017   NA 138 03/23/2017   K 3.7 03/23/2017   CL 103 03/23/2017   CO2 27 03/23/2017    Anesthesia Physical Anesthesia Plan  ASA: II  Anesthesia Plan: Regional and Spinal   Post-op Pain Management:  Regional for Post-op pain   Induction:   PONV Risk Score and Plan: 2 and Treatment may vary due to age or medical condition, Dexamethasone and Ondansetron  Airway Management Planned: Mask, Natural Airway and Nasal Cannula  Additional Equipment:   Intra-op Plan:   Post-operative Plan:   Informed Consent: I have reviewed the patients History and Physical, chart, labs and discussed the procedure including the risks, benefits and alternatives for  the proposed anesthesia with the patient or authorized representative who has indicated his/her understanding and acceptance.   Dental advisory given  Plan Discussed with: CRNA  Anesthesia Plan Comments:         Anesthesia Quick Evaluation

## 2017-03-30 NOTE — Anesthesia Postprocedure Evaluation (Signed)
Anesthesia Post Note  Patient: Algis LimingJoan Golladay  Procedure(s) Performed: LEFT TOTAL KNEE ARTHROPLASTY (Left Knee)     Patient location during evaluation: PACU Anesthesia Type: Spinal Level of consciousness: awake Pain management: pain level controlled Vital Signs Assessment: post-procedure vital signs reviewed and stable Respiratory status: spontaneous breathing Cardiovascular status: stable Postop Assessment: no apparent nausea or vomiting, no headache, no backache, spinal receding and patient able to bend at knees Anesthetic complications: no    Last Vitals:  Vitals:   03/30/17 1645 03/30/17 1715  BP: 121/70 132/74  Pulse: 83 86  Resp: 16 15  Temp:    SpO2: 98% 98%    Last Pain:  Vitals:   03/30/17 1715  TempSrc:   PainSc: 0-No pain   Pain Goal: Patients Stated Pain Goal: 3 (03/30/17 1101)               Jernard Reiber JR,JOHN Susann GivensFRANKLIN

## 2017-03-30 NOTE — Anesthesia Procedure Notes (Signed)
Anesthesia Regional Block: Adductor canal block   Pre-Anesthetic Checklist: ,, timeout performed, Correct Patient, Correct Site, Correct Laterality, Correct Procedure, Correct Position, site marked, Risks and benefits discussed,  Surgical consent,  Pre-op evaluation,  At surgeon's request and post-op pain management  Laterality: Left  Prep: chloraprep       Needles:  Injection technique: Single-shot  Needle Type: Echogenic Stimulator Needle     Needle Length: 9cm  Needle Gauge: 22     Additional Needles:   Procedures:,,,, ultrasound used (permanent image in chart),,,,  Narrative:  Start time: 03/30/2017 11:59 AM End time: 03/30/2017 12:08 PM Injection made incrementally with aspirations every 5 mL.  Performed by: Personally  Anesthesiologist: Trevor IhaHouser, Renley Banwart A, MD

## 2017-03-30 NOTE — Progress Notes (Signed)
PATIENT ID:      Stacey LimingJoan Bryant  MRN:     [id number] DOB/AGE:    1945-12-06 / 72 y.o.       OPERATIVE REPORT    DATE OF PROCEDURE:  03/30/2017       PREOPERATIVE DIAGNOSIS: End Stage  Osteoarthritis Left Knee                                                       Estimated body mass index is 28.19 kg/m as calculated from the following:   Height as of 03/23/17: 5' 5.5" (1.664 m).   Weight as of this encounter: 172 lb (78 kg).     POSTOPERATIVE DIAGNOSIS:End Stage  Osteoarthritis Left Knee                                                                     Estimated body mass index is 28.19 kg/m as calculated from the following:   Height as of 03/23/17: 5' 5.5" (1.664 m).   Weight as of this encounter: 172 lb (78 kg).     PROCEDURE:  Procedure(s): LEFT TOTAL KNEE ARTHROPLASTY      SURGEON:  Norlene CampbellPeter Timotheus Salm, MD    ASSISTANT:   Jacqualine CodeBrian Petrarca, PA-C   (Present and scrubbed throughout the case, critical for assistance with exposure, retraction, instrumentation, and closure.)          ANESTHESIA: regional and spinal     DRAINS: none :      TOURNIQUET TIME:  Total Tourniquet Time Documented: Thigh (Left) - 65 minutes Total: Thigh (Left) - 65 minutes     COMPLICATIONS:  None   CONDITION:  stable  PROCEDURE IN DETAIL: [id number]    Stacey Bryant 03/30/2017, 3:07 PM  Patient ID: Stacey Bryant, female   DOB: 1945-12-06, 72 y.o.   MRN: [id number]

## 2017-03-30 NOTE — Transfer of Care (Signed)
Immediate Anesthesia Transfer of Care Note  Patient: Stacey Bryant  Procedure(s) Performed: LEFT TOTAL KNEE ARTHROPLASTY (Left Knee)  Patient Location: PACU  Anesthesia Type:MAC combined with regional for post-op pain  Level of Consciousness: awake, alert  and oriented  Airway & Oxygen Therapy: Patient Spontanous Breathing  Post-op Assessment: Report given to RN and Post -op Vital signs reviewed and stable  Post vital signs: Reviewed and stable  Last Vitals:  Vitals:   03/30/17 1215 03/30/17 1530  BP: 129/67   Pulse: 84   Resp: 13   Temp:  (!) 36.1 C  SpO2: 100%     Last Pain:  Vitals:   03/30/17 1530  TempSrc:   PainSc: (P) 0-No pain      Patients Stated Pain Goal: 3 (93/81/01 7510)  Complications: No apparent anesthesia complications

## 2017-03-30 NOTE — Progress Notes (Signed)
The recent History & Physical has been reviewed. I have personally examined the patient today. There is no interval change to the documented History & Physical. The patient would like to proceed with the procedure.  Valeria Batmaneter W Shanen Norris 03/30/2017,  12:45 PM  Patient ID: Algis LimingJoan Ingham, female   DOB: 19-Sep-1945, 72 y.o.   MRN: [id number]

## 2017-03-30 NOTE — Anesthesia Procedure Notes (Signed)
Spinal  Start time: 03/30/2017 1:20 PM End time: 03/30/2017 1:28 PM Staffing Performed: anesthesiologist  Spinal Block Patient position: sitting Prep: Betadine Approach: midline Location: L2-3 Injection technique: single-shot Needle Needle type: Pencan  Needle gauge: 24 G Needle length: 9 cm

## 2017-03-31 ENCOUNTER — Encounter (HOSPITAL_COMMUNITY): Payer: Self-pay | Admitting: *Deleted

## 2017-03-31 ENCOUNTER — Other Ambulatory Visit (INDEPENDENT_AMBULATORY_CARE_PROVIDER_SITE_OTHER): Payer: Self-pay

## 2017-03-31 LAB — BASIC METABOLIC PANEL
ANION GAP: 9 (ref 5–15)
BUN: 7 mg/dL (ref 6–20)
CO2: 24 mmol/L (ref 22–32)
Calcium: 8.7 mg/dL — ABNORMAL LOW (ref 8.9–10.3)
Chloride: 106 mmol/L (ref 101–111)
Creatinine, Ser: 0.67 mg/dL (ref 0.44–1.00)
Glucose, Bld: 147 mg/dL — ABNORMAL HIGH (ref 65–99)
POTASSIUM: 4.1 mmol/L (ref 3.5–5.1)
SODIUM: 139 mmol/L (ref 135–145)

## 2017-03-31 LAB — CBC
HCT: 30.9 % — ABNORMAL LOW (ref 36.0–46.0)
Hemoglobin: 10.1 g/dL — ABNORMAL LOW (ref 12.0–15.0)
MCH: 28 pg (ref 26.0–34.0)
MCHC: 32.7 g/dL (ref 30.0–36.0)
MCV: 85.6 fL (ref 78.0–100.0)
PLATELETS: 234 10*3/uL (ref 150–400)
RBC: 3.61 MIL/uL — ABNORMAL LOW (ref 3.87–5.11)
RDW: 13.4 % (ref 11.5–15.5)
WBC: 11.4 10*3/uL — AB (ref 4.0–10.5)

## 2017-03-31 NOTE — Op Note (Signed)
NAME:  Stacey Bryant, Stacey Bryant                       ACCOUNT NO.:  MEDICAL RECORD NO.:  [id number]  LOCATION:                                 FACILITY:  PHYSICIAN:  Vonna Kotyk. Durward Fortes, M.D.    DATE OF BIRTH:  DATE OF PROCEDURE:  03/30/2017 DATE OF DISCHARGE:                              OPERATIVE REPORT   PREOPERATIVE DIAGNOSIS:  End-stage osteoarthritis, left knee.  POSTOPERATIVE DIAGNOSIS:  End-stage osteoarthritis, left knee.  PROCEDURE:  Left total knee replacement.  SURGEON:  Vonna Kotyk. Durward Fortes, MD.  ASSISTANT:  Biagio Borg, PAC.  ANESTHESIA:  Spinal with IV sedation and adductor canal block, left lower extremity.  COMPLICATIONS:  None.  COMPONENTS:  DePuy LCS standard plus femoral component #3 rotating keeled tibial tray, 12.5-mm polyethylene bridging bearing and metal- backed 3 peg rotating patella.  Components were secured with polymethyl methacrylate.  DESCRIPTION OF PROCEDURE:  Ms. Fennelly was met in the holding area, identified the left knee as appropriate operative site and marked it accordingly.  Anesthesia performed an adductor canal block.  The patient was then transported to room #7 and placed under spinal anesthetic per Anesthesia without difficulty.  She was then placed supine in the operating room table.  Nursing staff inserted a Foley catheter.  Urine was clear.  Tourniquet was then applied to the left thigh.  The left lower extremity was prepped with chlorhexidine scrub and DuraPrep x2.  Sterile draping was performed.  Time-out was called.  The left lower extremity was then elevated and Esmarch exsanguinated with a proximal tourniquet at 350 mmHg.  A midline longitudinal incision was made centered about the patella extending from the superior pouch to the tibial tubercle.  Via sharp dissection, the incision was carried down to subcutaneous tissue.  First layer of capsule was incised in the midline.  A medial parapatellar incision was then made through the  deep capsule with the Bovie.  The joint was entered.  A small clear yellow effusion was identified.  The patella was everted 180 degrees laterally, the knee flexed to 90 degrees.  There was abundant beefy red synovitis.  Synovectomy was performed. There were large osteophytes along the medial and lateral femoral condyle, medial tibial plateau, and the patella.  There were large areas and near complete absence of articular cartilage in the medial compartment.  There was a varus position preoperatively, but was easily correctable to neutral.  I measured a standard plus femoral component.  First, a bony cut was then made transversely on the tibia with the external tibial guide with a 7-degree angle of declination.  After each bony cut on the tibia and the femur, I used the external guide to be sure we had appropriate alignment.  Subsequent cuts were then made on the femur using the standard plus femoral jig.  I used a 4-degree distal femoral valgus cut.  Flexion and extension gaps were symmetrical at 12.5.  MCL and LCL remained intact throughout the procedure.  Laminar spreaders were then placed along the medial lateral compartment. I performed a medial lateral meniscectomy as well as excision of the ACL and PCL.  Osteophytes were removed from the  posterior femoral condyle using a 3/4-inch curved osteotome.  We again checked to be sure that our flexion and section gaps were symmetrical.  Finishing guide was then applied to the femur to obtain the tapered cuts in the center hole.  Retractors were then carefully placed about the tibia, was advanced anteriorly and measured a #3 tibial tray.  This was pinned in place. The center hole was then made followed by the keeled cut.  The trial 12.5-mm polyethylene bridging bearing was then inserted followed by the trial femoral component.  The entire construct was reduced and through a full range of motion, we had excellent range of motion and  no instability or opening with varus or valgus stress.  There was no rotation of the tibial tray.  The patella was prepared by removing approximately 9 mm of bone, leaving approximately 12.5 to 13 mm of patella thickness.  Three holes were then made, the trial patella inserted and reduced through a full range of motion, there was no subluxation.  The trial components were removed.  The joint was copiously irrigated with saline.  The final components were then impacted with polymethyl methacrylate. We initially applied the tibial tray followed by the 12.5-mm polyethylene bridging bearing and the standard plus femoral component. These were reduced and placed in extension.  Extraneous methacrylate was removed from the periphery of the components.  The patella was applied with methacrylate and a patellar clamp.  At approximately 16 minutes, the methacrylate had matured.  During this time, we injected the deep capsule with 0.25% Marcaine with epinephrine.  We then explored the joint.  Any further hardened extraneous methacrylate was removed with an osteotome and a mallet.  Tourniquet was deflated at 65 minutes.  Gross bleeders were Bovie coagulated.  We then re-applied topical tranexamic acid under compression for at least 5 minutes with a nice dry field.  Hemovac was not necessary.  The deep capsule was then closed with a running #1 Ethibond, superficial capsule in several layers with Vicryl and 3-0 Monocryl.  Skin was closed with skin clips and a sterile bulky dressing applied.  The patient tolerated the procedure without complications.     Vonna Kotyk. Durward Fortes, M.D.     PWW/MEDQ  D:  03/30/2017  T:  03/31/2017  Job:  [id number]

## 2017-03-31 NOTE — Evaluation (Signed)
Physical Therapy Evaluation Patient Details Name: Stacey Bryant MRN: [id number] DOB: [birth year] Today's Date: 03/31/2017   History of Present Illness  Admitted for LTKA, 50%PWB;  has a past medical history of Arthritis, GERD (gastroesophageal reflux disease), and Headache.  Clinical Impression   Pt is s/p TKA resulting in the deficits listed below (see PT Problem List). Independent prior to admission; R knee with arthritic pain as well; Overall moving well; I anticipate good progress; needs reinforcement of PWB LLE;  Pt will benefit from skilled PT to increase their independence and safety with mobility to allow discharge to the venue listed below.      Follow Up Recommendations DC plan and follow up therapy as arranged by surgeon;Other (comment)(arrangements made for rehab at [facility])    Equipment Recommendations  Rolling walker with 5" wheels;3in1 (PT)    Recommendations for Other Services       Precautions / Restrictions Precautions Precautions: Knee Precaution Comments: Pt educated to not allow any pillow or bolster under knee for healing with optimal range of motion.  Restrictions Weight Bearing Restrictions: Yes LLE Weight Bearing: Partial weight bearing LLE Partial Weight Bearing Percentage or Pounds: 50      Mobility  Bed Mobility Overal bed mobility: Needs Assistance Bed Mobility: Supine to Sit     Supine to sit: Min guard     General bed mobility comments: Cues for technqiue  Transfers Overall transfer level: Needs assistance Equipment used: Rolling walker (2 wheeled) Transfers: Sit to/from Stand Sit to Stand: Min guard         General transfer comment: Cues for hand placement, safety, and 50% PWB LLE  Ambulation/Gait Ambulation/Gait assistance: Min guard Ambulation Distance (Feet): (Hallway ambulation) Assistive device: Rolling walker (2 wheeled) Gait Pattern/deviations: Step-through pattern(emerging)     General Gait Details: Cues for gait  sequence for 50%PWB, and also to self-monitor for activity tolerance; good actrivation of L quad in stance  Stairs            Wheelchair Mobility    Modified Rankin (Stroke Patients Only)       Balance                                             Pertinent Vitals/Pain Pain Assessment: 0-10 Pain Score: 5  Pain Location: L knee with flexion therex Pain Descriptors / Indicators: Aching Pain Intervention(s): Monitored during session;Ice applied    Home Living Family/patient expects to be discharged to:: Skilled nursing facility([facility]) Living Arrangements: Alone                    Prior Function Level of Independence: Independent               Hand Dominance        Extremity/Trunk Assessment   Upper Extremity Assessment Upper Extremity Assessment: Overall WFL for tasks assessed    Lower Extremity Assessment Lower Extremity Assessment: LLE deficits/detail LLE Deficits / Details: Grossly decr AROM and strength as anticipated postop; good quad activation       Communication      Cognition Arousal/Alertness: Awake/alert Behavior During Therapy: WFL for tasks assessed/performed Overall Cognitive Status: Within Functional Limits for tasks assessed  General Comments      Exercises Total Joint Exercises Quad Sets: AROM;Left;15 reps Heel Slides: AROM;Left;15 reps Goniometric ROM: approx 0-72 deg   Assessment/Plan    PT Assessment Patient needs continued PT services  PT Problem List Decreased strength;Decreased range of motion;Decreased activity tolerance;Decreased balance;Decreased mobility;Decreased knowledge of use of DME;Decreased knowledge of precautions;Pain       PT Treatment Interventions DME instruction;Gait training;Stair training;Functional mobility training;Therapeutic activities;Therapeutic exercise;Balance training;Patient/family education    PT Goals  (Current goals can be found in the Care Plan section)  Acute Rehab PT Goals Patient Stated Goal: walk without pain PT Goal Formulation: With patient Time For Goal Achievement: 04/07/17 Potential to Achieve Goals: Good    Frequency 7X/week   Barriers to discharge        Co-evaluation               AM-PAC PT "6 Clicks" Daily Activity  Outcome Measure Difficulty turning over in bed (including adjusting bedclothes, sheets and blankets)?: A Little Difficulty moving from lying on back to sitting on the side of the bed? : A Little Difficulty sitting down on and standing up from a chair with arms (e.g., wheelchair, bedside commode, etc,.)?: A Little Help needed moving to and from a bed to chair (including a wheelchair)?: A Little Help needed walking in hospital room?: A Little Help needed climbing 3-5 steps with a railing? : A Lot 6 Click Score: 17    End of Session Equipment Utilized During Treatment: Gait belt Activity Tolerance: Patient tolerated treatment well Patient left: in chair;with call bell/phone within reach;with family/visitor present Nurse Communication: Mobility status PT Visit Diagnosis: Other abnormalities of gait and mobility (R26.89);Pain Pain - Right/Left: Left Pain - part of body: Knee    Time: 0825-0904 PT Time Calculation (min) (ACUTE ONLY): 39 min   Charges:   PT Evaluation $PT Eval Low Complexity: 1 Low PT Treatments $Gait Training: 8-22 mins $Therapeutic Activity: 8-22 mins   PT G Codes:        Van ClinesHolly Shaquile Lutze, PT  Acute Rehabilitation Services Pager [phone] Office [phone]   Levi AlandHolly H Nikolaus Pienta 03/31/2017, 9:16 AM

## 2017-03-31 NOTE — Discharge Instructions (Signed)
Information on my medicine - XARELTO® (Rivaroxaban) ° °This medication education was reviewed with me or my healthcare representative as part of my discharge preparation.  The pharmacist that spoke with me during my hospital stay was:  Yanissa Michalsky Dien, RPH ° °Why was Xarelto® prescribed for you? °Xarelto® was prescribed for you to reduce the risk of blood clots forming after orthopedic surgery. The medical term for these abnormal blood clots is venous thromboembolism (VTE). ° °What do you need to know about xarelto® ? °Take your Xarelto® ONCE DAILY at the same time every day. °You may take it either with or without food. ° °If you have difficulty swallowing the tablet whole, you may crush it and mix in applesauce just prior to taking your dose. ° °Take Xarelto® exactly as prescribed by your doctor and DO NOT stop taking Xarelto® without talking to the doctor who prescribed the medication.  Stopping without other VTE prevention medication to take the place of Xarelto® may increase your risk of developing a clot. ° °After discharge, you should have regular check-up appointments with your healthcare provider that is prescribing your Xarelto®.   ° °What do you do if you miss a dose? °If you miss a dose, take it as soon as you remember on the same day then continue your regularly scheduled once daily regimen the next day. Do not take two doses of Xarelto® on the same day.  ° °Important Safety Information °A possible side effect of Xarelto® is bleeding. You should call your healthcare provider right away if you experience any of the following: °? Bleeding from an injury or your nose that does not stop. °? Unusual colored urine (red or dark brown) or unusual colored stools (red or black). °? Unusual bruising for unknown reasons. °? A serious fall or if you hit your head (even if there is no bleeding). ° °Some medicines may interact with Xarelto® and might increase your risk of bleeding while on Xarelto®. To help avoid  this, consult your healthcare provider or pharmacist prior to using any new prescription or non-prescription medications, including herbals, vitamins, non-steroidal anti-inflammatory drugs (NSAIDs) and supplements. ° °This website has more information on Xarelto®: www.xarelto.com. ° ° ° °

## 2017-03-31 NOTE — Evaluation (Signed)
Occupational Therapy Evaluation Patient Details Name: Stacey Bryant MRN: [id number] DOB: 05/11/1945 Today's Date: 03/31/2017    History of Present Illness Admitted for LTKA, 50%PWB;  has a past medical history of Arthritis, GERD (gastroesophageal reflux disease), and Headache.   Clinical Impression   This 72 y/o F presents with the above. Pt lives alone, at baseline is independent with ADLs and functional mobility. Pt presenting with post-op pain and weakness, decreased dynamic balance and functional performance. Pt completing room level functional mobility at RW level with MinGuard-MinA throughout and min verbal cues for maintaining PWB status; currently requires MinA for LB ADLs. Pt reports plans for SNF level therapy services after discharge, will continue to follow acutely to maximize her safety and independence with ADLs and mobility.     Follow Up Recommendations  DC plan and follow up therapy as arranged by surgeon;Supervision/Assistance - 24 hour(Pt reports plans for SNF at d/c)    Equipment Recommendations  Other (comment)(defer to next venue )           Precautions / Restrictions Precautions Precautions: Knee Precaution Comments: verbally reviewed with Pt  Restrictions Weight Bearing Restrictions: Yes LLE Weight Bearing: Partial weight bearing LLE Partial Weight Bearing Percentage or Pounds: 50      Mobility Bed Mobility               General bed mobility comments: OOB in recliner upon arrival   Transfers Overall transfer level: Needs assistance Equipment used: Rolling walker (2 wheeled) Transfers: Sit to/from Stand Sit to Stand: Min guard;Min assist         General transfer comment: Cues for hand placement, safety, and 50% PWB LLE; MinGuard from recliner, MinA from toilet    Balance Overall balance assessment: Needs assistance Sitting-balance support: Feet supported Sitting balance-Leahy Scale: Good     Standing balance support: Bilateral upper  extremity supported;During functional activity;Single extremity supported;No upper extremity supported Standing balance-Leahy Scale: Poor Standing balance comment: Pt reliant on UE support during mobility; able to briefly maintain static standing at sink without UE support and light MinA from therapist                           ADL either performed or assessed with clinical judgement   ADL Overall ADL's : Needs assistance/impaired Eating/Feeding: Modified independent;Sitting   Grooming: Wash/dry hands;Standing;Minimal assistance   Upper Body Bathing: Min guard;Sitting   Lower Body Bathing: Minimal assistance;Sit to/from stand   Upper Body Dressing : Set up;Sitting   Lower Body Dressing: Minimal assistance;Sit to/from stand   Toilet Transfer: Minimal assistance;Ambulation;Comfort height toilet;Grab bars   Toileting- Clothing Manipulation and Hygiene: Minimal assistance;Sit to/from stand Toileting - Clothing Manipulation Details (indicate cue type and reason): assist for gown management      Functional mobility during ADLs: Minimal assistance;Rolling walker General ADL Comments: min verbal cues for maintaining PWB status during functional transfers and ADLs with Pt overall demonstrating good carryover                          Pertinent Vitals/Pain Pain Assessment: Faces Faces Pain Scale: Hurts little more Pain Location: L knee  Pain Descriptors / Indicators: Aching;Sore Pain Intervention(s): Monitored during session;Ice applied;Repositioned          Extremity/Trunk Assessment Upper Extremity Assessment Upper Extremity Assessment: Overall WFL for tasks assessed   Lower Extremity Assessment Lower Extremity Assessment: Defer to PT evaluation  Communication Communication Communication: No difficulties   Cognition Arousal/Alertness: Awake/alert Behavior During Therapy: WFL for tasks assessed/performed Overall Cognitive Status: Within Functional  Limits for tasks assessed                                                      Home Living Family/patient expects to be discharged to:: Skilled nursing facility([facility]) Living Arrangements: Alone                                      Prior Functioning/Environment Level of Independence: Independent                 OT Problem List: Decreased strength;Impaired balance (sitting and/or standing);Decreased range of motion;Decreased activity tolerance;Decreased knowledge of use of DME or AE;Pain      OT Treatment/Interventions: Self-care/ADL training;DME and/or AE instruction;Therapeutic activities;Balance training;Therapeutic exercise;Energy conservation;Patient/family education    OT Goals(Current goals can be found in the care plan section) Acute Rehab OT Goals Patient Stated Goal: walk without pain OT Goal Formulation: With patient Time For Goal Achievement: 04/14/17 Potential to Achieve Goals: Good  OT Frequency: Min 2X/week                             AM-PAC PT "6 Clicks" Daily Activity     Outcome Measure Help from another person eating meals?: None Help from another person taking care of personal grooming?: A Little Help from another person toileting, which includes using toliet, bedpan, or urinal?: A Little Help from another person bathing (including washing, rinsing, drying)?: A Lot Help from another person to put on and taking off regular upper body clothing?: None Help from another person to put on and taking off regular lower body clothing?: A Lot 6 Click Score: 18   End of Session Equipment Utilized During Treatment: Gait belt;Rolling walker Nurse Communication: Mobility status  Activity Tolerance: Patient tolerated treatment well Patient left: in chair;with call bell/phone within reach;with family/visitor present  OT Visit Diagnosis: Other abnormalities of gait and mobility (R26.89);Unsteadiness on feet  (R26.81)                Time: [id number] OT Time Calculation (min): 19 min Charges:  OT General Charges $OT Visit: 1 Visit OT Evaluation $OT Eval Low Complexity: 1 Low G-Codes:     Marcy SirenBreanna Shaine Mount, OT Pager [phone] 03/31/2017   Orlando PennerBreanna L Jakerria Kingbird 03/31/2017, 1:57 PM

## 2017-03-31 NOTE — NC FL2 (Signed)
[place] MEDICAID FL2 LEVEL OF CARE SCREENING TOOL     IDENTIFICATION  Patient Name: Stacey Bryant Birthdate: Feb 23, 1946 Sex: female Admission Date (Current Location): 03/30/2017  [facility] and [place] Number:  Producer, television/film/video and Address:  [facility] [place], [address], [place], [place] [address]      Provider Number: [id number]  Attending Physician Name and Address:  Valeria Batman, MD  Relative Name and Phone Number:  Memorie Yokoyama, son, [phone]    Current Level of Care: Hospital Recommended Level of Care: Skilled Nursing Facility Prior Approval Number:    Date Approved/Denied:   PASRR Number: [id number]  Discharge Plan: SNF    Current Diagnoses: Patient Active Problem List   Diagnosis Date Noted  . S/P total knee replacement using cement, left 03/30/2017  . Pain in right hip 03/17/2017  . Unilateral primary osteoarthritis, left knee 03/17/2017  . Unilateral primary osteoarthritis, right hip 03/17/2017    Orientation RESPIRATION BLADDER Height & Weight     Self, Time, Situation, Place  Normal Continent Weight: 172 lb 0 oz (78 kg) Height:  5\' 5"  (165.1 cm)  BEHAVIORAL SYMPTOMS/MOOD NEUROLOGICAL BOWEL NUTRITION STATUS      Continent Diet(See DC Summary)  AMBULATORY STATUS COMMUNICATION OF NEEDS Skin   Limited Assist Verbally Surgical wounds                       Personal Care Assistance Level of Assistance  Bathing, Feeding, Dressing Bathing Assistance: Limited assistance Feeding assistance: Independent Dressing Assistance: Limited assistance     Functional Limitations Info  Sight, Hearing, Speech Sight Info: Adequate Hearing Info: Adequate Speech Info: Adequate    SPECIAL CARE FACTORS FREQUENCY  PT (By licensed PT), OT (By licensed OT)     PT Frequency: 5x week OT Frequency: 5x week            Contractures      Additional Factors Info  Code Status, Allergies Code Status Info: Full Allergies Info:  PENICILLIN G            Current Medications (03/31/2017):  This is the current hospital active medication list Current Facility-Administered Medications  Medication Dose Route Frequency Provider Last Rate Last Dose  . acetaminophen (OFIRMEV) IV 1,000 mg  1,000 mg Intravenous Q6H Jetty Peeks, PA-C   Stopped at 03/31/17 1149  . alum & mag hydroxide-simeth (MAALOX/MYLANTA) 200-200-20 MG/5ML suspension 30 mL  30 mL Oral Q4H PRN Petrarca, Brian D, PA-C      . bisacodyl (DULCOLAX) suppository 10 mg  10 mg Rectal Daily PRN Jacqualine Code D, PA-C      . cholecalciferol (VITAMIN D) tablet 1,000 Units  1,000 Units Oral Daily Jetty Peeks, PA-C   1,000 Units at 03/31/17 0901  . diphenhydrAMINE (BENADRYL) 12.5 MG/5ML elixir 12.5-25 mg  12.5-25 mg Oral Q4H PRN Jacqualine Code D, PA-C      . docusate sodium (COLACE) capsule 100 mg  100 mg Oral BID Jacqualine Code D, PA-C   100 mg at 03/31/17 0900  . HYDROmorphone (DILAUDID) injection 0.5 mg  0.5 mg Intravenous Q4H PRN Jacqualine Code D, PA-C      . magnesium citrate solution 1 Bottle  1 Bottle Oral Once PRN Petrarca, Oris Drone, PA-C      . menthol-cetylpyridinium (CEPACOL) lozenge 3 mg  1 lozenge Oral PRN Jacqualine Code D, PA-C       Or  . phenol (CHLORASEPTIC) mouth spray 1 spray  1 spray Mouth/Throat PRN Jetty Peeksetrarca, Brian D, PA-C      . methocarbamol (ROBAXIN) tablet 500 mg  500 mg Oral Q6H PRN Jetty Peeksetrarca, Brian D, PA-C   500 mg at 03/30/17 1725   Or  . methocarbamol (ROBAXIN) 500 mg in dextrose 5 % 50 mL IVPB  500 mg Intravenous Q6H PRN Petrarca, Brian D, PA-C      . metoCLOPramide (REGLAN) tablet 5-10 mg  5-10 mg Oral Q8H PRN Jacqualine CodePetrarca, Brian D, PA-C       Or  . metoCLOPramide (REGLAN) injection 5-10 mg  5-10 mg Intravenous Q8H PRN Petrarca, Brian D, PA-C      . ondansetron (ZOFRAN) tablet 4 mg  4 mg Oral Q6H PRN Jacqualine CodePetrarca, Brian D, PA-C       Or  . ondansetron (ZOFRAN) injection 4 mg  4 mg Intravenous Q6H PRN Jacqualine CodePetrarca, Brian D, PA-C      .  oxyCODONE (Oxy IR/ROXICODONE) immediate release tablet 5 mg  5 mg Oral Q3H PRN Jacqualine CodePetrarca, Brian D, PA-C   5 mg at 03/30/17 1724  . oxyCODONE (Oxy IR/ROXICODONE) immediate release tablet 5 mg  5 mg Oral Q3H PRN Jacqualine CodePetrarca, Brian D, PA-C   5 mg at 03/31/17 1423  . polyethylene glycol (MIRALAX / GLYCOLAX) packet 17 g  17 g Oral Daily PRN Petrarca, Brian D, PA-C      . polyvinyl alcohol (LIQUIFILM TEARS) 1.4 % ophthalmic solution 1 drop  1 drop Both Eyes BID Valeria BatmanWhitfield, Peter W, MD   1 drop at 03/31/17 1013  . rivaroxaban (XARELTO) tablet 10 mg  10 mg Oral Q breakfast Jetty Peeksetrarca, Brian D, PA-C   10 mg at 03/31/17 0900     Discharge Medications: Please see discharge summary for a list of discharge medications.  Relevant Imaging Results:  Relevant Lab Results:   Additional Information SS#: [id number]  Chirag Krueger V Oak SpringsPencil, LCSW

## 2017-03-31 NOTE — Progress Notes (Signed)
Physical Therapy Treatment Patient Details Name: Stacey Bryant MRN: [id number] DOB: [birth year] Today's Date: 03/31/2017    History of Present Illness Admitted for LTKA, 50%PWB;  has a past medical history of Arthritis, GERD (gastroesophageal reflux disease), and Headache.    PT Comments    Continuing work on functional mobility and activity tolerance;  Overall progressing well; Anticipate continuing good progress at post-acute rehabilitation.    Follow Up Recommendations  DC plan and follow up therapy as arranged by surgeon;Other (comment)(arrangements made for [facility])     Equipment Recommendations  Rolling walker with 5" wheels;3in1 (PT)    Recommendations for Other Services       Precautions / Restrictions Precautions Precautions: Knee Precaution Booklet Issued: Yes (comment) Precaution Comments: verbally reviewed with Pt  Restrictions Weight Bearing Restrictions: Yes LLE Weight Bearing: Partial weight bearing LLE Partial Weight Bearing Percentage or Pounds: 50    Mobility  Bed Mobility Overal bed mobility: Needs Assistance Bed Mobility: Supine to Sit;Sit to Supine     Supine to sit: Min guard Sit to supine: Min guard   General bed mobility comments: Cues for technqiue  Transfers Overall transfer level: Needs assistance Equipment used: Rolling walker (2 wheeled) Transfers: Sit to/from Stand Sit to Stand: Min assist         General transfer comment: Cues for hand placement, safety, and 50% PWB LLE; MinA to rise from lower surface  Ambulation/Gait Ambulation/Gait assistance: Min guard Ambulation Distance (Feet): (Hallway ambulation) Assistive device: Rolling walker (2 wheeled) Gait Pattern/deviations: Step-through pattern(emerging)     General Gait Details: Cues for gait sequence for 50%PWB, and also to self-monitor for activity tolerance; good actrivation of L quad in stance   Stairs            Wheelchair Mobility    Modified Rankin  (Stroke Patients Only)       Balance Overall balance assessment: Needs assistance Sitting-balance support: Feet supported Sitting balance-Leahy Scale: Good     Standing balance support: Bilateral upper extremity supported;During functional activity;Single extremity supported;No upper extremity supported Standing balance-Leahy Scale: Poor Standing balance comment: Pt reliant on UE support during mobility; able to briefly maintain static standing at sink without UE support and light MinA from therapist                            Cognition Arousal/Alertness: Awake/alert Behavior During Therapy: WFL for tasks assessed/performed Overall Cognitive Status: Within Functional Limits for tasks assessed                                        Exercises Total Joint Exercises Ankle Circles/Pumps: AROM;Both;20 reps Quad Sets: AROM;Left;20 reps Towel Squeeze: AROM;Both;10 reps Short Arc Quad: AROM;Left;10 reps Heel Slides: AROM;Left;10 reps Hip ABduction/ADduction: AROM;Left;10 reps Straight Leg Raises: AROM;Left;10 reps Knee Flexion: AAROM;Left;Seated(1)    General Comments        Pertinent Vitals/Pain Pain Assessment: 0-10 Pain Score: 5  Faces Pain Scale: Hurts little more Pain Location: L knee  Pain Descriptors / Indicators: Aching;Sore Pain Intervention(s): Monitored during session    Home Living Family/patient expects to be discharged to:: Skilled nursing facility([facility]) Living Arrangements: Alone                  Prior Function Level of Independence: Independent          PT Goals (current  goals can now be found in the care plan section) Acute Rehab PT Goals Patient Stated Goal: walk without pain PT Goal Formulation: With patient Time For Goal Achievement: 04/07/17 Potential to Achieve Goals: Good Progress towards PT goals: Progressing toward goals    Frequency    7X/week      PT Plan Current plan remains appropriate     Co-evaluation              AM-PAC PT "6 Clicks" Daily Activity  Outcome Measure  Difficulty turning over in bed (including adjusting bedclothes, sheets and blankets)?: A Little Difficulty moving from lying on back to sitting on the side of the bed? : A Little Difficulty sitting down on and standing up from a chair with arms (e.g., wheelchair, bedside commode, etc,.)?: A Little Help needed moving to and from a bed to chair (including a wheelchair)?: A Little Help needed walking in hospital room?: A Little Help needed climbing 3-5 steps with a railing? : A Lot 6 Click Score: 17    End of Session Equipment Utilized During Treatment: Gait belt Activity Tolerance: Patient tolerated treatment well Patient left: in chair;with call bell/phone within reach Nurse Communication: Mobility status PT Visit Diagnosis: Other abnormalities of gait and mobility (R26.89);Pain Pain - Right/Left: Left Pain - part of body: Knee     Time: 1610-9604 PT Time Calculation (min) (ACUTE ONLY): 40 min  Charges:  $Gait Training: 8-22 mins $Therapeutic Exercise: 8-22 mins $Therapeutic Activity: 8-22 mins                    G Codes:       Stacey Bryant, PT  Acute Rehabilitation Services Pager [phone] Office [phone]    Stacey Bryant 03/31/2017, 4:59 PM

## 2017-03-31 NOTE — Progress Notes (Signed)
PATIENT ID: Stacey LimingJoan Bryant        MRN:  [id number]          DOB/AGE: [birth year] / 72 y.o.    Norlene CampbellPeter Jaylinn Hellenbrand, MD   Jacqualine CodeBrian Petrarca, PA-C [address] [place], [place]  [id number]                             [phone]   PROGRESS NOTE  Subjective:  negative for Chest Pain  negative for Shortness of Breath  negative for Nausea/Vomiting   negative for Calf Pain    Tolerating Diet: yes         Patient reports pain as mild.     Comfortable this am without related problems  Objective: Vital signs in last 24 hours:    Patient Vitals for the past 24 hrs:  BP Temp Temp src Pulse Resp SpO2 Weight  03/31/17 0401 115/61 (!) 97.4 F (36.3 C) Oral 81 16 - -  03/31/17 0300 - - - - - 98 % -  03/31/17 0007 125/66 97.6 F (36.4 C) Oral 90 16 98 % -  03/30/17 1849 139/64 97.8 F (36.6 C) Oral 92 16 95 % -  03/30/17 1809 138/69 (!) 97.2 F (36.2 C) - (!) 102 16 98 % -  03/30/17 1745 127/63 - - 90 16 98 % -  03/30/17 1730 - - - 94 15 99 % -  03/30/17 1715 132/74 - - 86 15 98 % -  03/30/17 1645 121/70 - - 83 16 98 % -  03/30/17 1630 122/74 - - 85 14 100 % -  03/30/17 1615 (!) 122/59 - - 83 15 100 % -  03/30/17 1600 131/70 - - 87 14 100 % -  03/30/17 1545 117/64 - - 87 14 100 % -  03/30/17 1530 (!) 114/53 (!) 97 F (36.1 C) - 90 16 100 % -  03/30/17 1215 129/67 - - 84 13 100 % -  03/30/17 1210 (!) 127/53 - - 85 13 99 % -  03/30/17 1205 (!) 129/53 - - 81 12 95 % -  03/30/17 1200 (!) 146/74 - - 88 11 100 % -  03/30/17 1155 136/66 - - 89 15 100 % -  03/30/17 1102 (!) 142/77 98 F (36.7 C) Oral 82 18 99 % 172 lb (78 kg)      Intake/Output from previous day:   01/15 0701 - 01/16 0700 In: 2190 [P.O.:440; I.V.:1750] Out: 3050 [Urine:3000]   Intake/Output this shift:   No intake/output data recorded.   Intake/Output      01/15 0701 - 01/16 0700 01/16 0701 - 01/17 0700   P.O. 440    I.V. (mL/kg) 1750 (22.4)    Total Intake(mL/kg) 2190 (28.1)    Urine (mL/kg/hr) 3000    Blood 50    Total Output 3050    Net -860            LABORATORY DATA: Recent Labs    03/31/17 0511  WBC 11.4*  HGB 10.1*  HCT 30.9*  PLT 234   Recent Labs    03/31/17 0511  NA 139  K 4.1  CL 106  CO2 24  BUN 7  CREATININE 0.67  GLUCOSE 147*  CALCIUM 8.7*   Lab Results  Component Value Date   INR 1.11 03/23/2017    Recent Radiographic Studies :  Dg Chest 2 View  Result Date: 03/24/2017 CLINICAL DATA:  Preop knee  replacement EXAM: CHEST  2 VIEW COMPARISON:  None. FINDINGS: The heart size and mediastinal contours are within normal limits. Both lungs are clear. The visualized skeletal structures are unremarkable. IMPRESSION: No active cardiopulmonary disease. Electronically Signed   By: Marlan Palau M.D.   On: 03/24/2017 08:29   Xr Hip Unilat W Or W/o Pelvis 2-3 Views Right  Result Date: 03/17/2017 X-rays today performed of the right hip and pelvis reveals significant degenerative arthritis of the right hip. There is periarticular spurring as well as joint space narrowing and cystic changes and subchondral sclerosing.    Examination:  General appearance: alert, cooperative and no distress  Wound Exam: clean, dry, intact   Drainage:  None: wound tissue dry  Motor Exam: EHL, FHL, Anterior Tibial and Posterior Tibial Intact  Sensory Exam: Superficial Peroneal, Deep Peroneal and Tibial normal  Vascular Exam: Normal  Assessment:    1 Day Post-Op  Procedure(s) (LRB): LEFT TOTAL KNEE ARTHROPLASTY (Left)  ADDITIONAL DIAGNOSIS:  Active Problems:   S/P total knee replacement using cement, left     Plan: Physical Therapy as ordered Partial Weight Bearing @ 50% (PWB)  DVT Prophylaxis:  Xarelto, Foot Pumps and TED hose  DISCHARGE PLAN: Skilled Nursing Facility/[facility]  DISCHARGE NEEDS: HHPT, CPM, Walker and 3-in-1 comode seat OOB with PT, saline lock IV, dressing change in am       Jacqualine Code, Cordelia Poche [facility]  03/31/2017 7:35 AM  Patient ID:  Stacey Bryant, female   DOB: [birth year], 72 y.o.   MRN: [id number]

## 2017-03-31 NOTE — Clinical Social Work Note (Signed)
Clinical Social Work Assessment  Patient Details  Name: Stacey LimingJoan Capes MRN: [id number] Date of Birth: 1945/10/02  Date of referral:  03/31/17               Reason for consult:  Facility Placement                Permission sought to share information with:  Oceanographer[facility]ility Contact Representative Permission granted to share information::  Yes, Verbal Permission Granted  Name::     ArtistMeghan  Agency::  [facility]  Relationship::  daughter  Contact Information:     Housing/Transportation Living arrangements for the past 2 months:  Single Family Home Source of Information:  Patient Patient Interpreter Needed:  None Criminal Activity/Legal Involvement Pertinent to Current Situation/Hospitalization:  No - Comment as needed Significant Relationships:  Adult Children, Other Family Members Lives with:  Self Do you feel safe going back to the place where you live?  No Need for family participation in patient care:  No (Coment)  Care giving concerns:  Pt with new impairment and clinical team recommending SNF. Pt resides alone and was independent prior to impairment. CSW obtained permission to send to SNF's and pt interested in [facility] and has already pre-registered. CSW confirmed SNF bed offer with admissions. CSW explained SNF process. Pt desires to be transported by daughter. CSW encouraged patient to discuss with medical doctor to confirm as CSW usually sets up transport through ambulance company.   Social Worker assessment / plan:  CSW will assist with disposition when medically ready.  Employment status:  Retired Database administratornsurance information:  Managed Medicare PT Recommendations:  Skilled Nursing Facility Information / Referral to community resources:  Skilled Nursing Facility  Patient/Family's Response to care:  Patient thanked CSW for following up to discuss SNF plans as she has already pre-arranged for placement. CSW f/u with SNF. No issues with care reported in face to face visit.  Patient/Family's  Understanding of and Emotional Response to Diagnosis, Current Treatment, and Prognosis:  CSW discussed with patient plans for SNF and patient in agreement as she pre-arranged for SNF placement at [facility]. Pt aware that rehab will help with mobility and she cannot return home yet. CSW will continue to follow for discharge to SNF when medically ready. Pt voiced understanding throughout discussion.   Emotional Assessment Appearance:  Appears stated age Attitude/Demeanor/Rapport:  (Cooperative) Affect (typically observed):  Accepting, Appropriate Orientation:  Oriented to Situation, Oriented to  Time, Oriented to Place, Oriented to Self Alcohol / Substance use:  Not Applicable Psych involvement (Current and /or in the community):  No (Comment)  Discharge Needs  Concerns to be addressed:  Discharge Planning Concerns Readmission within the last 30 days:  No Current discharge risk:  Dependent with Mobility, Physical Impairment Barriers to Discharge:  No Barriers Identified   Tresa Mooreatricia V Kailee Essman, LCSW 03/31/2017, 12:55 PM

## 2017-04-01 LAB — CBC
HEMATOCRIT: 28 % — AB (ref 36.0–46.0)
Hemoglobin: 9.3 g/dL — ABNORMAL LOW (ref 12.0–15.0)
MCH: 28.4 pg (ref 26.0–34.0)
MCHC: 33.2 g/dL (ref 30.0–36.0)
MCV: 85.4 fL (ref 78.0–100.0)
Platelets: 228 10*3/uL (ref 150–400)
RBC: 3.28 MIL/uL — AB (ref 3.87–5.11)
RDW: 13.7 % (ref 11.5–15.5)
WBC: 7.8 10*3/uL (ref 4.0–10.5)

## 2017-04-01 LAB — BASIC METABOLIC PANEL
ANION GAP: 9 (ref 5–15)
BUN: 8 mg/dL (ref 6–20)
CO2: 24 mmol/L (ref 22–32)
Calcium: 8.5 mg/dL — ABNORMAL LOW (ref 8.9–10.3)
Chloride: 104 mmol/L (ref 101–111)
Creatinine, Ser: 0.68 mg/dL (ref 0.44–1.00)
GFR calc Af Amer: >60 mL/min (ref >60)
GFR calc non Af Amer: >60 mL/min (ref >60)
GLUCOSE: 132 mg/dL — AB (ref 65–99)
POTASSIUM: 3.6 mmol/L (ref 3.5–5.1)
Sodium: 137 mmol/L (ref 135–145)

## 2017-04-01 MED ORDER — METHOCARBAMOL 500 MG PO TABS: 500.0000 mg | ORAL_TABLET | Freq: Three times a day (TID) | ORAL | 0 refills | Status: DC | PRN

## 2017-04-01 MED ORDER — OXYCODONE HCL 5 MG PO TABS: 5.0000 mg | ORAL_TABLET | ORAL | 0 refills | Status: DC | PRN

## 2017-04-01 MED ORDER — RIVAROXABAN 10 MG PO TABS: 10.0000 mg | ORAL_TABLET | Freq: Every day | ORAL | 0 refills | Status: DC

## 2017-04-01 NOTE — Progress Notes (Signed)
Pt given discharge instructions and prescriptions. Instructions gone over with her and daughter, answered all questions to satisfaction. Instructed to give paperwork to staff at [facility]. All belongings gathered. Pt's daughter to transport her to facility. Pt in no distress at time of discharge. Report called and given to [facility] at [facility].

## 2017-04-01 NOTE — Progress Notes (Signed)
PATIENT ID: Eula Jaster        MRN:  [id number]          DOB/AGE: [birth year] / 72 y.o.    Norlene Campbell, MD   Jacqualine Code, PA-C [address] [place], [place]  [address]                             [phone]   PROGRESS NOTE  Subjective:  negative for Chest Pain  negative for Shortness of Breath  negative for Nausea/Vomiting   negative for Calf Pain    Tolerating Diet: yes         Patient reports pain as moderate.     Pain controlled with meds  Objective: Vital signs in last 24 hours:    Patient Vitals for the past 24 hrs:  BP Temp Temp src Pulse Resp SpO2 Height Weight  04/01/17 0735 (!) 106/53 98.4 F (36.9 C) Oral 99 18 94 % - -  03/31/17 2141 (!) 122/52 (!) 97.5 F (36.4 C) Oral 84 18 98 % - -  03/31/17 1341 (!) 125/49 98 F (36.7 C) Oral 78 17 98 % - -  03/31/17 1307 - - - - - - 5\' 5"  (1.651 m) 172 lb 0 oz (78 kg)      Intake/Output from previous day:   01/16 0701 - 01/17 0700 In: 336 [P.O.:236] Out: -    Intake/Output this shift:   No intake/output data recorded.   Intake/Output      01/16 0701 - 01/17 0700 01/17 0701 - 01/18 0700   P.O. 236    I.V. (mL/kg)     IV Piggyback 100    Total Intake(mL/kg) 336 (4.3)    Urine (mL/kg/hr)     Blood     Total Output     Net +336         Urine Occurrence 2 x 3 x      LABORATORY DATA: Recent Labs    03/31/17 0511  WBC 11.4*  HGB 10.1*  HCT 30.9*  PLT 234   Recent Labs    03/31/17 0511  NA 139  K 4.1  CL 106  CO2 24  BUN 7  CREATININE 0.67  GLUCOSE 147*  CALCIUM 8.7*   Lab Results  Component Value Date   INR 1.11 03/23/2017    Recent Radiographic Studies :  Dg Chest 2 View  Result Date: 03/24/2017 CLINICAL DATA:  Preop knee replacement EXAM: CHEST  2 VIEW COMPARISON:  None. FINDINGS: The heart size and mediastinal contours are within normal limits. Both lungs are clear. The visualized skeletal structures are unremarkable. IMPRESSION: No active cardiopulmonary disease. Electronically  Signed   By: Marlan Palau M.D.   On: 03/24/2017 08:29   Xr Hip Unilat W Or W/o Pelvis 2-3 Views Right  Result Date: 03/17/2017 X-rays today performed of the right hip and pelvis reveals significant degenerative arthritis of the right hip. There is periarticular spurring as well as joint space narrowing and cystic changes and subchondral sclerosing.    Examination:  General appearance: alert, cooperative and no distress  Wound Exam: clean, dry, intact   Drainage:  None: wound tissue dry  Motor Exam: EHL, FHL, Anterior Tibial and Posterior Tibial Intact  Sensory Exam: Superficial Peroneal, Deep Peroneal and Tibial normal  Vascular Exam: Normal  Assessment:    2 Days Post-Op  Procedure(s) (LRB): LEFT TOTAL KNEE ARTHROPLASTY (Left)  ADDITIONAL DIAGNOSIS:  Active  Problems:   S/P total knee replacement using cement, left  Acute Blood Loss Anemia-asymptomatic   Plan: Physical Therapy as ordered Partial Weight Bearing @ 50% (PWB)  DVT Prophylaxis:  Xarelto, Foot Pumps and TED hose  DISCHARGE PLAN: Skilled Nursing Facility/Rehab  DISCHARGE NEEDS: HHPT, CPM, Walker and 3-in-1 comode seat Ready for [facility]. Dressing changed left knee-wound clean and dry. Good effort in PT       Jacqualine CodeBrian Petrarca, [place]PA-C [facility]  04/01/2017 9:19 AM  Patient ID: Algis LimingJoan Rekowski, female   DOB: 1945/11/28, 72 y.o.   MRN: [id number]

## 2017-04-01 NOTE — Plan of Care (Signed)
  Health Behavior/Discharge Planning: Ability to manage health-related needs will improve 04/01/2017 1149 - Adequate for Discharge by Ephraim HamburgerAdams, Remijio Holleran M, RN   Pain Managment: General experience of comfort will improve 04/01/2017 1149 - Adequate for Discharge by Ephraim HamburgerAdams, Nilson Tabora M, RN   Safety: Ability to remain free from injury will improve 04/01/2017 1149 - Adequate for Discharge by Ephraim HamburgerAdams, Callee Rohrig M, RN

## 2017-04-01 NOTE — Clinical Social Work Placement (Addendum)
   CLINICAL SOCIAL WORK PLACEMENT  NOTE  Date:  04/01/2017  Patient Details  Name: Stacey Bryant MRN: [id number] Date of Birth: 10-10-1945  Clinical Social Work is seeking post-discharge placement for this patient at the Skilled  Nursing Facility level of care (*CSW will initial, date and re-position this form in  chart as items are completed):  Yes   Patient/family provided with [facility] Clinical Social Work Department's list of facilities offering this level of care within the geographic area requested by the patient (or if unable, by the patient's family).  Yes   Patient/family informed of their freedom to choose among providers that offer the needed level of care, that participate in Medicare, Medicaid or managed care program needed by the patient, have an available bed and are willing to accept the patient.  Yes   Patient/family informed of [facility]'s ownership interest in [facility] and [facility], as well as of the fact that they are under no obligation to receive care at these facilities.  PASRR submitted to EDS on       PASRR number received on 03/31/17     Existing PASRR number confirmed on       FL2 transmitted to all facilities in geographic area requested by pt/family on 03/31/17     FL2 transmitted to all facilities within larger geographic area on       Patient informed that his/her managed care company has contracts with or will negotiate with certain facilities, including the following:        Yes   Patient/family informed of bed offers received.  [facility]     Physician recommends and patient chooses bed at      [facility] 03[facility].  Patient to be transferred to facility by family, daughter at bedside. Clinical team confirmed.     Patient family notified on 04/01/17 of transfer.  Name of family member notified:  pt responsible for self     PHYSICIAN        Additional Comment:    _______________________________________________ Tresa MoorePatricia V Kizer Nobbe, LCSW 04/01/2017, 8:57 AM

## 2017-04-01 NOTE — Social Work (Addendum)
Clinical Social Worker facilitated patient discharge including contacting patient family and facility to confirm patient discharge plans.  Clinical information faxed to facility and family agreeable with plan.    [facility] by family at bedside.  RN confirmed with doctor.    RN to call [phone] report prior to discharge. Pt going to Room 7003.  Clinical Social Worker will sign off for now as social work intervention is no longer needed. Please consult us again if new need arises.  Keene BreathPatricia Monserratt Knezevic, LCSW Clinical Social Worker [phone]

## 2017-04-01 NOTE — Discharge Summary (Signed)
Norlene CampbellPeter Whitfield, MD   Jacqualine CodeBrian Valdemar Mcclenahan, PA-C [address], [place], [place]  [address]                             [phone]  PATIENT ID: Stacey LimingJoan Bryant        MRN:  [id number]          DOB/AGE: [birth year] / 72 y.o.    DISCHARGE SUMMARY  ADMISSION DATE:    03/30/2017 DISCHARGE DATE:   04/01/2017   ADMISSION DIAGNOSIS: Osteoarthritis Left Knee    DISCHARGE DIAGNOSIS:  Osteoarthritis Left Knee    ADDITIONAL DIAGNOSIS: Active Problems:   S/P total knee replacement using cement, left  Past Medical History:  Diagnosis Date  . Arthritis   . GERD (gastroesophageal reflux disease)   . Headache    HX  MIGRAINES    PROCEDURE: Procedure(s): LEFT TOTAL KNEE ARTHROPLASTY  on 03/30/2017  CONSULTS: none    HISTORY: Stacey Bryant, 72 y.o. female, has a history of pain and functional disability in the left knee due to arthritis and has failed non-surgical conservative treatments for greater than 12 weeks to includeNSAID's and/or analgesics, corticosteriod injections, viscosupplementation injections, flexibility and strengthening excercises and activity modification.  Onset of symptoms was gradual, starting 5 years ago with gradually worsening course since that time. The patient noted prior procedures on the knee to include  arthroscopy on the left knee(s).  Patient currently rates pain in the left knee(s) at 7 out of 10 with activity. Patient has night pain, worsening of pain with activity and weight bearing, pain that interferes with activities of daily living, pain with passive range of motion, crepitus and joint swelling.  Patient has evidence of subchondral cysts, subchondral sclerosis, periarticular osteophytes and joint space narrowing by imaging studies. There is no active infection.    HOSPITAL COURSE:  Stacey Bryant is a 72 y.o. admitted on 03/30/2017 and found to have a diagnosis of Osteoarthritis Left Knee.  After appropriate laboratory studies were obtained  they were taken to the operating  room on 03/30/2017 and underwent  Procedure(s): LEFT TOTAL KNEE ARTHROPLASTY  .   They were given perioperative antibiotics:  Anti-infectives (From admission, onward)   Start     Dose/Rate Route Frequency Ordered Stop   03/30/17 1930  ceFAZolin (ANCEF) IVPB 2g/100 mL premix     2 g 200 mL/hr over 30 Minutes Intravenous Every 6 hours 03/30/17 1832 03/31/17 0259   03/30/17 1057  ceFAZolin (ANCEF) IVPB 2g/100 mL premix     2 g 200 mL/hr over 30 Minutes Intravenous On call to O.R. 03/30/17 1057 03/30/17 1343    .  Tolerated the procedure well.  Placed with a foley intraoperatively.    Toradol was given post op.  POD #1, allowed out of bed to a chair.  PT for ambulation and exercise program.  Foley D/C'd in morning.  IV saline locked.  O2 discontionued.  POD #2, continued PT and ambulation.     The remainder of the hospital course was dedicated to ambulation and strengthening.   The patient was discharged on 2 Days Post-Op in  Stable condition.  Blood products given:none  DIAGNOSTIC STUDIES: Recent vital signs:  Patient Vitals for the past 24 hrs:  BP Temp Temp src Pulse Resp SpO2 Height Weight  04/01/17 0735 (!) 106/53 98.4 F (36.9 C) Oral 99 18 94 % - -  03/31/17 2141 (!) 122/52 (!) 97.5 F (36.4 C) Oral 84  18 98 % - -  03/31/17 1341 (!) 125/49 98 F (36.7 C) Oral 78 17 98 % - -  03/31/17 1307 - - - - - - 5\' 5"  (1.651 m) 172 lb 0 oz (78 kg)       Recent laboratory studies: Recent Labs    03/31/17 0511  WBC 11.4*  HGB 10.1*  HCT 30.9*  PLT 234   Recent Labs    03/31/17 0511  NA 139  K 4.1  CL 106  CO2 24  BUN 7  CREATININE 0.67  GLUCOSE 147*  CALCIUM 8.7*   Lab Results  Component Value Date   INR 1.11 03/23/2017     Recent Radiographic Studies :  Dg Chest 2 View  Result Date: 03/24/2017 CLINICAL DATA:  Preop knee replacement EXAM: CHEST  2 VIEW COMPARISON:  None. FINDINGS: The heart size and mediastinal contours are within normal limits. Both lungs are  clear. The visualized skeletal structures are unremarkable. IMPRESSION: No active cardiopulmonary disease. Electronically Signed   By: Marlan Palau M.D.   On: 03/24/2017 08:29   Xr Hip Unilat W Or W/o Pelvis 2-3 Views Right  Result Date: 03/17/2017 X-rays today performed of the right hip and pelvis reveals significant degenerative arthritis of the right hip. There is periarticular spurring as well as joint space narrowing and cystic changes and subchondral sclerosing.   DISCHARGE INSTRUCTIONS: Discharge Instructions    CPM   Complete by:  As directed    Continuous passive motion machine (CPM):      Use the CPM from 0 to 60 degrees for 6-8 hours per day.      You may increase by 5-10 per day.  You may break it up into 2 or 3 sessions per day.      Use CPM for 3-4 weeks or until you are told to stop.   Call MD / Call 911   Complete by:  As directed    If you experience chest pain or shortness of breath, CALL 911 and be transported to the hospital emergency room.  If you develope a fever above 101 F, pus (white drainage) or increased drainage or redness at the wound, or calf pain, call your surgeon's office.   Change dressing   Complete by:  As directed    DO NOT CHANGE YOUR DRESSING.   Constipation Prevention   Complete by:  As directed    Drink plenty of fluids.  Prune juice may be helpful.  You may use a stool softener, such as Colace (over the counter) 100 mg twice a day.  Use MiraLax (over the counter) for constipation as needed.   Diet general   Complete by:  As directed    Discharge instructions   Complete by:  As directed    INSTRUCTIONS AFTER JOINT REPLACEMENT   Remove items at home which could result in a fall. This includes throw rugs or furniture in walking pathways ICE to the affected joint every three hours while awake for 30 minutes at a time, for at least the first 3-5 days, and then as needed for pain and swelling.  Continue to use ice for pain and swelling. You may  notice swelling that will progress down to the foot and ankle.  This is normal after surgery.  Elevate your leg when you are not up walking on it.   Continue to use the breathing machine you got in the hospital (incentive spirometer) which will help keep your temperature down.  It is common for your temperature to cycle up and down following surgery, especially at night when you are not up moving around and exerting yourself.  The breathing machine keeps your lungs expanded and your temperature down.   DIET:  As you were doing prior to hospitalization, we recommend a well-balanced diet.  DRESSING / WOUND CARE / SHOWERING  Keep the surgical dressing until follow up.  The dressing is water proof, so you can shower without any extra covering.  IF THE DRESSING FALLS OFF or the wound gets wet inside, change the dressing with sterile gauze.  Please use good hand washing techniques before changing the dressing.  Do not use any lotions or creams on the incision until instructed by your surgeon.    ACTIVITY  Increase activity slowly as tolerated, but follow the weight bearing instructions below.   No driving for 6 weeks or until further direction given by your physician.  You cannot drive while taking narcotics.  No lifting or carrying greater than 10 lbs. until further directed by your surgeon. Avoid periods of inactivity such as sitting longer than an hour when not asleep. This helps prevent blood clots.  You may return to work once you are authorized by your doctor.     WEIGHT BEARING   Partial weight bearing with assist device as directed.  50%   EXERCISES  Results after joint replacement surgery are often greatly improved when you follow the exercise, range of motion and muscle strengthening exercises prescribed by your doctor. Safety measures are also important to protect the joint from further injury. Any time any of these exercises cause you to have increased pain or swelling, decrease what  you are doing until you are comfortable again and then slowly increase them. If you have problems or questions, call your caregiver or physical therapist for advice.   Rehabilitation is important following a joint replacement. After just a few days of immobilization, the muscles of the leg can become weakened and shrink (atrophy).  These exercises are designed to build up the tone and strength of the thigh and leg muscles and to improve motion. Often times heat used for twenty to thirty minutes before working out will loosen up your tissues and help with improving the range of motion but do not use heat for the first two weeks following surgery (sometimes heat can increase post-operative swelling).   These exercises can be done on a training (exercise) mat,  on a table or on a bed. Use whatever works the best and is most comfortable for you.    Use music or television while you are exercising so that the exercises are a pleasant break in your day. This will make your life better with the exercises acting as a break in your routine that you can look forward to.   Perform all exercises about fifteen times, three times per day or as directed.  You should exercise both the operative leg and the other leg as well.   Exercises include:  Quad Sets - Tighten up the muscle on the front of the thigh (Quad) and hold for 5-10 seconds.   Straight Leg Raises - With your knee straight (if you were given a brace, keep it on), lift the leg to 60 degrees, hold for 3 seconds, and slowly lower the leg.  Perform this exercise against resistance later as your leg gets stronger.  Leg Slides: Lying on your back, slowly slide your foot toward your buttocks, bending your knee  up off the floor (only go as far as is comfortable). Then slowly slide your foot back down until your leg is flat on the floor again.  Angel Wings: Lying on your back spread your legs to the side as far apart as you can without causing discomfort.  Hamstring  Strength:  Lying on your back, push your heel against the floor with your leg straight by tightening up the muscles of your buttocks.  Repeat, but this time bend your knee to a comfortable angle, and push your heel against the floor.  You may put a pillow under the heel to make it more comfortable if necessary.   A rehabilitation program following joint replacement surgery can speed recovery and prevent re-injury in the future due to weakened muscles. Contact your doctor or a physical therapist for more information on knee rehabilitation.    CONSTIPATION  Constipation is defined medically as fewer than three stools per week and severe constipation as less than one stool per week.  Even if you have a regular bowel pattern at home, your normal regimen is likely to be disrupted due to multiple reasons following surgery.  Combination of anesthesia, postoperative narcotics, change in appetite and fluid intake all can affect your bowels.   YOU MUST use at least one of the following options; they are listed in order of increasing strength to get the job done.  They are all available over the counter, and you may need to use some, POSSIBLY even all of these options:    Drink plenty of fluids (prune juice may be helpful) and high fiber foods Colace 100 mg by mouth twice a day  Senokot for constipation as directed and as needed Dulcolax (bisacodyl), take with full glass of water  Miralax (polyethylene glycol) once or twice a day as needed.  If you have tried all these things and are unable to have a bowel movement in the first 3-4 days after surgery call either your surgeon or your primary doctor.    If you experience loose stools or diarrhea, hold the medications until you stool forms back up.  If your symptoms do not get better within 1 week or if they get worse, check with your doctor.  If you experience "the worst abdominal pain ever" or develop nausea or vomiting, please contact the office immediately  for further recommendations for treatment.   ITCHING:  If you experience itching with your medications, try taking only a single pain pill, or even half a pain pill at a time.  You can also use Benadryl over the counter for itching or also to help with sleep.   TED HOSE STOCKINGS:  Use stockings on both legs until for at least 2 weeks or as directed by physician office. They may be removed at night for sleeping.  MEDICATIONS:  See your medication summary on the "After Visit Summary" that nursing will review with you.  You may have some home medications which will be placed on hold until you complete the course of blood thinner medication.  It is important for you to complete the blood thinner medication as prescribed.  PRECAUTIONS:  If you experience chest pain or shortness of breath - call 911 immediately for transfer to the hospital emergency department.   If you develop a fever greater that 101 F, purulent drainage from wound, increased redness or drainage from wound, foul odor from the wound/dressing, or calf pain - CONTACT YOUR SURGEON.  FOLLOW-UP APPOINTMENTS:  If you do not already have a post-op appointment, please call the office for an appointment to be seen by your surgeon.  Guidelines for how soon to be seen are listed in your "After Visit Summary", but are typically between 1-4 weeks after surgery.  OTHER INSTRUCTIONS:   Knee Replacement:  Do not place pillow under knee, focus on keeping the knee straight while resting. CPM instructions: 0-90 degrees, 2 hours in the morning, 2 hours in the afternoon, and 2 hours in the evening. Place foam block, curve side up under heel at all times except when in CPM or when walking.  DO NOT modify, tear, cut, or change the foam block in any way.  MAKE SURE YOU:  Understand these instructions.  Get help right away if you are not doing well or get worse.    Thank you for letting us be a part of  your medical care team.  It is a privilege we respect greatly.  We hope these instructions will help you stay on track for a fast and full recovery!   Do not put a pillow under the knee. Place it under the heel.   Complete by:  As directed    Driving restrictions   Complete by:  As directed    No driving for 6 weeks   Increase activity slowly as tolerated   Complete by:  As directed    Lifting restrictions   Complete by:  As directed    No lifting for 6 weeks   Partial weight bearing   Complete by:  As directed    % Body Weight:  50%   Laterality:  left   Extremity:  Lower   Patient may shower   Complete by:  As directed    You may shower over your dressing   TED hose   Complete by:  As directed    Use stockings (TED hose) for 3 weeks on left  leg.  You may remove them at night for sleeping.      DISCHARGE MEDICATIONS:   Allergies as of 04/01/2017      Reactions   Penicillin G Rash   Has patient had a PCN reaction causing immediate rash, facial/tongue/throat swelling, SOB or lightheadedness with hypotension: No Has patient had a PCN reaction causing severe rash involving mucus membranes or skin necrosis: No Has patient had a PCN reaction that required hospitalization: No Has patient had a PCN reaction occurring within the last 10 years: No If all of the above answers are "NO", then may proceed with Cephalosporin use.      Medication List    STOP taking these medications   diclofenac sodium 1 % Gel Commonly known as:  VOLTAREN     TAKE these medications   acetaminophen 650 MG CR tablet Commonly known as:  TYLENOL Take 1,300 mg by mouth daily.   cholecalciferol 1000 units tablet Commonly known as:  VITAMIN D Take 1,000 Units by mouth daily.   methocarbamol 500 MG tablet Commonly known as:  ROBAXIN Take 1 tablet (500 mg total) by mouth every 8 (eight) hours as needed for muscle spasms.   oxyCODONE 5 MG immediate release tablet Commonly known as:  Oxy  IR/ROXICODONE Take 1-2 tablets (5-10 mg total) by mouth every 4 (four) hours as needed for moderate pain or severe pain ((score 4 to 6)).   rivaroxaban 10 MG Tabs tablet Commonly known as:  XARELTO Take 1 tablet (10 mg total) by mouth daily  with breakfast. Start taking on:  04/02/2017   SYSTANE OP Apply 1 drop to eye 2 (two) times daily.            Durable Medical Equipment  (From admission, onward)        Start     Ordered   03/30/17 1833  DME Walker rolling  Once    Question:  Patient needs a walker to treat with the following condition  Answer:  S/P total knee replacement using cement, right   03/30/17 1832   03/30/17 1833  DME 3 n 1  Once     03/30/17 1832   03/30/17 1833  DME Bedside commode  Once    Question:  Patient needs a bedside commode to treat with the following condition  Answer:  S/P total knee replacement using cement, right   03/30/17 1832       Discharge Care Instructions  (From admission, onward)        Start     Ordered   04/01/17 0000  Partial weight bearing    Question Answer Comment  % Body Weight 50%   Laterality left   Extremity Lower      04/01/17 0926   04/01/17 0000  Change dressing    Comments:  DO NOT CHANGE YOUR DRESSING.   04/01/17 0926      FOLLOW UP VISIT:    Contact information for follow-up providers    Jetty Peeks, PA-C Follow up on 04/12/2017.   Specialty:  Orthopedic Surgery Contact information: [address] [place] [address] [phone]            Contact information for after-discharge care    Destination    [facility] Follow up.   Service:  Skilled Nursing Contact information: [address] [place] [address] [phone]                  DISPOSITION:   Skilled Nursing Facility/Rehab  CONDITION:  Stable   Oris Drone. Aleda Grana [facility] [phone]  04/01/2017 9:28 AM

## 2017-04-01 NOTE — Progress Notes (Signed)
Orthopedic Tech Progress Note Patient Details:  Stacey Bryant 1946/03/12 [id number]  Patient ID: Stacey Bryant, female   DOB: 1946/03/12, 72 y.o.   MRN: [id number] Pt stated just took pain meds and will call when ready for cpm  Trinna PostMartinez, Emory Gallentine J 04/01/2017, 5:49 AM

## 2017-04-12 ENCOUNTER — Ambulatory Visit (INDEPENDENT_AMBULATORY_CARE_PROVIDER_SITE_OTHER): Payer: Medicare Other | Admitting: Orthopaedic Surgery

## 2017-04-12 ENCOUNTER — Ambulatory Visit (INDEPENDENT_AMBULATORY_CARE_PROVIDER_SITE_OTHER): Payer: Medicare Other

## 2017-04-12 ENCOUNTER — Encounter (INDEPENDENT_AMBULATORY_CARE_PROVIDER_SITE_OTHER): Payer: Self-pay | Admitting: Orthopaedic Surgery

## 2017-04-12 VITALS — BP 140/64 | HR 86 | Ht 67.0 in | Wt 172.0 lb

## 2017-04-12 DIAGNOSIS — Z96652 Presence of left artificial knee joint: Secondary | ICD-10-CM

## 2017-04-12 MED ORDER — TRAMADOL HCL 50 MG PO TABS: 50.0000 mg | ORAL_TABLET | Freq: Three times a day (TID) | ORAL | 0 refills | Status: DC | PRN

## 2017-04-12 NOTE — Progress Notes (Signed)
   Office Visit Note   Patient: Stacey Bryant           Date of Birth: 1946/02/24           MRN: [id number] Visit Date: 04/12/2017              Requested by: Raynelle JanSpry, Heather M., MD [address] [place], [place] [id number] PCP: Raynelle JanSpry, Heather M., MD   Assessment & Plan: Visit Diagnoses:  1. History of left knee replacement     Plan: 2 weeks status post primary left total knee replacement doing well. Spent a short time at [facility]. Now home. Independent with a walker. Prescription for tramadol. Stop xarelto. Weightbearing as tolerated. Office 2 weeks  Follow-Up Instructions: Return in about 2 weeks (around 04/26/2017).   Orders:  Orders Placed This Encounter  Procedures  . XR Knee Complete 4 Views Left   No orders of the defined types were placed in this encounter.     Procedures: No procedures performed   Clinical Data: No additional findings.   Subjective: No chief complaint on file. 13 days status post primary left total knee replacement doing well. Now home from rehabilitation. Quite comfortable. Occasional use of Percocet which causes stomach upset. Muscle relaxant has been helpful. No related fever or chills. We will try tramadol for pain .office 2 weeks  HPI  Review of Systems   Objective: Vital Signs: BP 140/64 (BP Location: Left Arm, Patient Position: Sitting, Cuff Size: Normal)   Pulse 86   Ht 5\' 7"  (1.702 m)   Wt 172 lb (78 kg)   BMI 26.94 kg/m   Physical Exam  Ortho Exam awake alert and oriented 3. Comfortable sitting. At least 95 left knee flexion and full extension. Steri-Strips strips applied after clips were removed. No calf pain. Neurovascular exam intact  Specialty Comments:  No specialty comments available.  Imaging: No results found.   PMFS History: Patient Active Problem List   Diagnosis Date Noted  . S/P total knee replacement using cement, left 03/30/2017  . Pain in right hip 03/17/2017  . Unilateral primary  osteoarthritis, left knee 03/17/2017  . Unilateral primary osteoarthritis, right hip 03/17/2017   Past Medical History:  Diagnosis Date  . Arthritis   . GERD (gastroesophageal reflux disease)   . Headache    HX  MIGRAINES    History reviewed. No pertinent family history.  Past Surgical History:  Procedure Laterality Date  . JOINT REPLACEMENT    . KNEE ARTHROSCOPY     BILATERAL   . 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 TOTAL KNEE ARTHROPLASTY Left 03/30/2017   Procedure: LEFT TOTAL KNEE ARTHROPLASTY;  Surgeon: Valeria BatmanWhitfield, Alka Falwell W, MD;  [facility];  Service: Orthopedics;  Laterality: Left;   Social History   Occupational History  . Not on file  Tobacco Use  . Smoking status: Never Smoker  . Smokeless tobacco: Never Used  Substance and Sexual Activity  . Alcohol use: Yes    Alcohol/week: 0.6 oz    Types: 1 Glasses of wine per week    Comment: OCC  . Drug use: No  . Sexual activity: Not on file     Valeria BatmanPeter W Kidada Ging, MD   Note - This record has been created using [employer] software.  Chart creation errors have been sought, but may not always  have been located. Such creation errors do not reflect on  the standard of medical care.

## 2017-04-21 ENCOUNTER — Telehealth (INDEPENDENT_AMBULATORY_CARE_PROVIDER_SITE_OTHER): Payer: Self-pay | Admitting: Orthopaedic Surgery

## 2017-04-21 NOTE — Telephone Encounter (Signed)
Patient left a voicemail stating that she needed a prescription refill of her Methocarbamol.  Patient uses [facility] [address] [place].

## 2017-04-22 NOTE — Telephone Encounter (Signed)
Rx for 20 ok

## 2017-04-22 NOTE — Telephone Encounter (Signed)
OK 

## 2017-04-23 ENCOUNTER — Telehealth (INDEPENDENT_AMBULATORY_CARE_PROVIDER_SITE_OTHER): Payer: Self-pay | Admitting: Orthopaedic Surgery

## 2017-04-23 ENCOUNTER — Other Ambulatory Visit (INDEPENDENT_AMBULATORY_CARE_PROVIDER_SITE_OTHER): Payer: Self-pay

## 2017-04-23 MED ORDER — METHOCARBAMOL 500 MG PO TABS: 500.0000 mg | ORAL_TABLET | Freq: Two times a day (BID) | ORAL | 0 refills | Status: DC | PRN

## 2017-04-23 NOTE — Telephone Encounter (Signed)
Patient called to see if it would be possible to have the prescription of Methocarbamol [facility] [place].  She has physical therapy today and is always sore after and wants to be able to take it this evening.

## 2017-04-23 NOTE — Telephone Encounter (Signed)
done

## 2017-04-26 ENCOUNTER — Ambulatory Visit (INDEPENDENT_AMBULATORY_CARE_PROVIDER_SITE_OTHER): Payer: Medicare Other | Admitting: Orthopaedic Surgery

## 2017-04-26 ENCOUNTER — Encounter (INDEPENDENT_AMBULATORY_CARE_PROVIDER_SITE_OTHER): Payer: Self-pay | Admitting: Orthopaedic Surgery

## 2017-04-26 VITALS — BP 135/67 | HR 77 | Resp 16 | Ht 65.5 in | Wt 165.0 lb

## 2017-04-26 DIAGNOSIS — Z96652 Presence of left artificial knee joint: Secondary | ICD-10-CM

## 2017-04-26 NOTE — Progress Notes (Signed)
Office Visit Note   Patient: Stacey Bryant           Date of Birth: 1945/06/23           MRN: [id number] Visit Date: 04/26/2017              Requested by: Raynelle Jan., MD [address] [place], [place] [address] PCP: Raynelle Jan., MD   Assessment & Plan: Visit Diagnoses:  1. History of left knee replacement     Plan: One month status post left knee replacement and doing quite well. Uses a single-point cane for ambulation. Long discussion regarding need for exercises. See again in 3 months. Okay to drive. Discuss use of vitamin E for the incision.  Follow-Up Instructions: Return in about 3 months (around 07/24/2017).   Orders:  No orders of the defined types were placed in this encounter.  No orders of the defined types were placed in this encounter.     Procedures: No procedures performed   Clinical Data: No additional findings.   Subjective: Chief Complaint  Patient presents with  . Left Knee - Routine Post Op    Mrs. Stacey Bryant is a 72 y o S/P 4 weeks Left total knee arthroplasty. She relates she has moved to using a cane . Takes pain meds for night. Tylenol for day.  Progressing very nicely with exercises at home and physical therapy. Has achieved 0-102 per therapy. Happy with progress  HPI  Review of Systems  Constitutional: Negative for chills, fatigue and fever.  HENT: Negative for hearing loss and tinnitus.   Eyes: Negative for itching.  Respiratory: Negative for chest tightness and shortness of breath.   Cardiovascular: Negative for chest pain, palpitations and leg swelling.  Gastrointestinal: Negative for blood in stool, constipation and diarrhea.  Endocrine: Negative for polyuria.  Genitourinary: Negative for dysuria.  Musculoskeletal: Negative for back pain, joint swelling, neck pain and neck stiffness.  Allergic/Immunologic: Negative for immunocompromised state.  Neurological: Negative for dizziness, numbness and headaches.  Hematological: Does  not bruise/bleed easily.  Psychiatric/Behavioral: Negative for sleep disturbance. The patient is not nervous/anxious.      Objective: Vital Signs: BP 135/67   Pulse 77   Resp 16   Ht 5' 5.5" (1.664 m)   Wt 165 lb (74.8 kg)   BMI 27.04 kg/m   Physical Exam  Ortho Exam awake alert and oriented 3. Comfortable sitting. Full extension and left knee. Some edema around the knee as expected. Flexed about 100 without instability. No popliteal pain. No calf pain. No distal edema. Neurovascular exam intact. Incision healing without problem.  Specialty Comments:  No specialty comments available.  Imaging: No results found.   PMFS History: Patient Active Problem List   Diagnosis Date Noted  . S/P total knee replacement using cement, left 03/30/2017  . Pain in right hip 03/17/2017  . Unilateral primary osteoarthritis, left knee 03/17/2017  . Unilateral primary osteoarthritis, right hip 03/17/2017   Past Medical History:  Diagnosis Date  . Arthritis   . GERD (gastroesophageal reflux disease)   . Headache    HX  MIGRAINES    History reviewed. No pertinent family history.  Past Surgical History:  Procedure Laterality Date  . JOINT REPLACEMENT    . KNEE ARTHROSCOPY     BILATERAL   . 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 TOTAL KNEE ARTHROPLASTY Left 03/30/2017   Procedure: LEFT TOTAL KNEE ARTHROPLASTY;  Surgeon: Valeria Batman, MD;  Location: [facility] OR;  Service: Orthopedics;  Laterality: Left;   Social History   Occupational History  . Not on file  Tobacco Use  . Smoking status: Never Smoker  . Smokeless tobacco: Never Used  Substance and Sexual Activity  . Alcohol use: Yes    Alcohol/week: 0.6 oz    Types: 1 Glasses of wine per week    Comment: OCC  . Drug use: No  . Sexual activity: Not on file

## 2017-05-04 ENCOUNTER — Ambulatory Visit: Payer: Medicare Other | Attending: Orthopaedic Surgery | Admitting: Physical Therapy

## 2017-05-04 ENCOUNTER — Other Ambulatory Visit: Payer: Self-pay

## 2017-05-04 DIAGNOSIS — M25662 Stiffness of left knee, not elsewhere classified: Secondary | ICD-10-CM

## 2017-05-04 DIAGNOSIS — M25562 Pain in left knee: Secondary | ICD-10-CM | POA: Diagnosis present

## 2017-05-04 DIAGNOSIS — R29898 Other symptoms and signs involving the musculoskeletal system: Secondary | ICD-10-CM | POA: Diagnosis present

## 2017-05-04 DIAGNOSIS — R262 Difficulty in walking, not elsewhere classified: Secondary | ICD-10-CM | POA: Insufficient documentation

## 2017-05-04 NOTE — Patient Instructions (Addendum)
Strengthening: Straight Leg Raise (Phase 1)   Tighten muscles on front of left thigh, then lift leg __~8__ inches from surface, keeping knee locked.  Repeat __15__ times per set. Do ___2-3_ sets per session.   Straight Leg Raise: With External Leg Rotation   Lie on back with left leg straight, opposite leg bent. Rotate straight leg out and lift __~8__ inches. Repeat __15__ times per set. Do __2-3__ sets per session.   Bridge   Lie back, legs bent. Inhale, pressing hips up. Keeping ribs in, lengthen lower back. Exhale, rolling down along spine from top. Repeat __15__ times. Do __2__ sessions per day.  Heel Slide   Bend knee and pull heel toward buttocks. Hold __5-10__ seconds. Return.  Repeat __15__ times. Do __2-3__ sessions per day.  Long [employer] Quad   Straighten operated leg and try to hold it _5___ seconds. Use __-__ lbs on ankle. Repeat __15__ times. Do __2-3__ sessions a day.  Heel Raise: Bilateral (Standing)   Rise on balls of feet. Repeat _15___ times per set. Do _2___ sets per session.

## 2017-05-04 NOTE — Therapy (Signed)
[facility] [address]  [address] [place], [place], [address] Phone: [phone]   Fax:  [phone]  Physical Therapy Evaluation  Patient Details  Name: Stacey Bryant MRN: [id number] Date of Birth: 1945/09/15 Referring Provider: Dr. Norlene Campbell   Encounter Date: 05/04/2017  PT End of Session - 05/04/17 1237    Visit Number  1    Number of Visits  12    Date for PT Re-Evaluation  06/15/17    PT Start Time  1103    PT Stop Time  1142    PT Time Calculation (min)  39 min    Activity Tolerance  Patient tolerated treatment well    Behavior During Therapy  [facility] for tasks assessed/performed       Past Medical History:  Diagnosis Date  . Arthritis   . GERD (gastroesophageal reflux disease)   . Headache    HX  MIGRAINES    Past Surgical History:  Procedure Laterality Date  . JOINT REPLACEMENT    . KNEE ARTHROSCOPY     BILATERAL   . 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 TOTAL KNEE ARTHROPLASTY Left 03/30/2017   Procedure: LEFT TOTAL KNEE ARTHROPLASTY;  Surgeon: Valeria Batman, MD;  Location: [facility] OR;  Service: Orthopedics;  Laterality: Left;    There were no vitals filed for this visit.   Subjective Assessment - 05/04/17 1104    Subjective  patient s/p L TKA on 03/30/17. Went to [facility] for 1 week - PT for 7 days. Just finished up 3 weeks of HHPT - feels like she cant bend it as much after last week of HHPT. Now knee feels stiff - front and lateral side of knee. Pain is well controlled. Does ice knee 3x/daily. Using [facility] in community.     Patient Stated Goals  improve walking and function of L knee    Currently in Pain?  Yes    Pain Score  2     Pain Location  Knee    Pain Orientation  Left    Pain Descriptors / Indicators  Aching;Sore    Pain Type  Acute pain;Surgical pain    Pain Onset  More than a month ago    Pain Frequency  Intermittent    Aggravating Factors   too much activity; flexion stretching     Pain Relieving Factors  extension + ice         [facility] PT Assessment - 05/04/17 1109      Assessment   Medical Diagnosis  s/p L TKA    Referring Provider  Dr. Norlene Campbell    Onset Date/Surgical Date  03/30/17    Next MD Visit  -- May 2019    Prior Therapy  yes      Precautions   Precautions  Fall      Restrictions   Weight Bearing Restrictions  No      Balance Screen   Has the patient fallen in the past 6 months  No    Has the patient had a decrease in activity level because of a fear of falling?   No    Is the patient reluctant to leave their home because of a fear of falling?   No      Home Environment   Living Environment  Private residence    Living Arrangements  Alone    Type of Home  House    Home Access  Stairs to enter  Entrance Stairs-Number of Steps  3    Home Layout  One level    Home Equipment  [place] - single point      Prior Function   Level of Independence  Independent    Vocation  Retired    Leisure  exercising      Cognition   Overall Cognitive Status  Within Functional Limits for tasks assessed      Observation/Other Assessments   Focus on Therapeutic Outcomes (FOTO)   Knee: 48 (52% limited, predicted 35% limited)      Sensation   Light Touch  Appears Intact      Coordination   Gross Motor Movements are Fluid and Coordinated  Yes      Posture/Postural Control   Posture/Postural Control  No significant limitations      ROM / Strength   AROM / PROM / Strength  AROM;PROM;Strength      AROM   AROM Assessment Site  Knee    Right/Left Knee  Left    Left Knee Extension  2    Left Knee Flexion  90      PROM   PROM Assessment Site  Knee    Right/Left Knee  Left    Left Knee Extension  0    Left Knee Flexion  100      Strength   Strength Assessment Site  Hip;Knee    Right/Left Hip  Right;Left    Right Hip Flexion  4/5    Left Hip Flexion  3+/5    Right/Left Knee  Right;Left    Right Knee Flexion  4/5    Right Knee Extension  4/5     Left Knee Flexion  4-/5    Left Knee Extension  4-/5      Palpation   Patella mobility  hypomobility noted in all planes      Ambulation/Gait   Ambulation/Gait  Yes    Ambulation/Gait Assistance  6: Modified independent (Device/Increase time)    Ambulation Distance (Feet)  100 Feet    Assistive device  Straight cane    Gait Pattern  Step-through pattern;Decreased step length - right;Decreased stance time - left;Decreased hip/knee flexion - left;Decreased weight shift to left    Ambulation Surface  Level;Indoor             Objective measurements completed on examination: See above findings.      [facility] Adult PT Treatment/Exercise - 05/04/17 1109      Exercises   Exercises  Knee/Hip      Knee/Hip Exercises: Standing   Heel Raises  Both;15 reps      Knee/Hip Exercises: Seated   Long Arc Quad  Left;15 reps      Knee/Hip Exercises: Supine   Heel Slides  Left;10 reps    Bridges  Both;15 reps    Straight Leg Raises  Left;15 reps    Straight Leg Raise with External Rotation  Left;15 reps             PT Education - 05/04/17 1236    Education provided  Yes    Education Details  exam findings, POC, HEP    Person(s) Educated  Patient    Methods  Explanation;Demonstration;Handout    Comprehension  Verbalized understanding;Returned demonstration       PT Short Term Goals - 05/04/17 1242      PT SHORT TERM GOAL #1   Title  patient to be independent with initial HEP    Status  New  Target Date  05/25/17      PT SHORT TERM GOAL #2   Title  patient to improve L knee AROM 0-110    Status  New    Target Date  05/25/17        PT Long Term Goals - 05/04/17 1243      PT LONG TERM GOAL #1   Title  patient to be independent with advanced HEP    Status  New    Target Date  06/15/17      PT LONG TERM GOAL #2   Title  patient to demonstrate L knee AROM 0-120    Status  New    Target Date  06/15/17      PT LONG TERM GOAL #3   Title  patient to improve L  LE strength to >/= 4+/5    Status  New    Target Date  06/15/17      PT LONG TERM GOAL #4   Title  patient to demonstrate reciprocal gait pattern up/down 1 flight of stairs without evidence of instability    Status  New    Target Date  06/15/17      PT LONG TERM GOAL #5   Title  patient to demonstrate L SLR without quad lag demonstrating increased strength    Status  New    Target Date  06/15/17             Plan - 05/04/17 1237    Clinical Impression Statement  [name] is a pleasant 72 y/o female presenting to OPPT today s/p L TKA on 03/30/17. Patient with short stay in short term rehab + HHPT. Patient now ambulating within community with [employer] [employer] with reduced heel strike and knee flexion throughout stance phase of gait. Good AROM and PROM today that will continue to benefit from PT services to maximize this for full functional mobility. Patient also to benefit from strengtheing program to allow for normal gait mechanics and functional mobility without instability.     Clinical Presentation  Stable    Clinical Decision Making  Low    Rehab Potential  Good    PT Frequency  2x / week    PT Duration  6 weeks    PT Treatment/Interventions  ADLs/Self Care Home Management;Cryotherapy;Electrical Stimulation;Iontophoresis 4mg /ml Dexamethasone;Moist Heat;Therapeutic exercise;Therapeutic activities;Functional mobility training;Stair training;Gait training;DME Instruction;Ultrasound;Balance training;Neuromuscular re-education;Patient/family education;Manual techniques;Vasopneumatic Device;Taping;Energy conservation;Dry needling;Passive range of motion    Consulted and Agree with Plan of Care  Patient       Patient will benefit from skilled therapeutic intervention in order to improve the following deficits and impairments:  Abnormal gait, Decreased activity tolerance, Decreased balance, Decreased range of motion, Decreased mobility, Difficulty walking, Pain, Decreased strength  Visit  Diagnosis: Acute pain of left knee  Stiffness of left knee, not elsewhere classified  Difficulty in walking, not elsewhere classified  Other symptoms and signs involving the musculoskeletal system     Problem List Patient Active Problem List   Diagnosis Date Noted  . S/P total knee replacement using cement, left 03/30/2017  . Pain in right hip 03/17/2017  . Unilateral primary osteoarthritis, left knee 03/17/2017  . Unilateral primary osteoarthritis, right hip 03/17/2017    Kipp LaurenceStephanie R Roneisha Stern, PT, DPT 05/04/17 12:48 PM   [facility] [address]  [address] [place], [place], [address] Phone: [phone]   Fax:  [phone]  Name: Algis LimingJoan Hiltunen MRN: [id number] Date of Birth: November 17, 1945

## 2017-05-06 ENCOUNTER — Ambulatory Visit: Payer: Medicare Other

## 2017-05-06 DIAGNOSIS — R29898 Other symptoms and signs involving the musculoskeletal system: Secondary | ICD-10-CM

## 2017-05-06 DIAGNOSIS — M25562 Pain in left knee: Secondary | ICD-10-CM | POA: Diagnosis not present

## 2017-05-06 DIAGNOSIS — M25662 Stiffness of left knee, not elsewhere classified: Secondary | ICD-10-CM

## 2017-05-06 DIAGNOSIS — R262 Difficulty in walking, not elsewhere classified: Secondary | ICD-10-CM

## 2017-05-06 NOTE — Therapy (Signed)
[facility] [address]  [address] [place], [place], [address] Phone: [phone]   Fax:  [phone]  Physical Therapy Treatment  Patient Details  Name: Stacey Bryant MRN: [id number] Date of Birth: [birth year] Referring Provider: Dr. Norlene Campbell   Encounter Date: 05/06/2017  PT End of Session - 05/06/17 1450    Visit Number  2    Number of Visits  12    Date for PT Re-Evaluation  06/15/17    PT Start Time  1445    PT Stop Time  1545    PT Time Calculation (min)  60 min    Activity Tolerance  Patient tolerated treatment well    Behavior During Therapy  [facility] for tasks assessed/performed       Past Medical History:  Diagnosis Date  . Arthritis   . GERD (gastroesophageal reflux disease)   . Headache    HX  MIGRAINES    Past Surgical History:  Procedure Laterality Date  . JOINT REPLACEMENT    . KNEE ARTHROSCOPY     BILATERAL   . 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 TOTAL KNEE ARTHROPLASTY Left 03/30/2017   Procedure: LEFT TOTAL KNEE ARTHROPLASTY;  Surgeon: Valeria Batman, MD;  Location: [facility] OR;  Service: Orthopedics;  Laterality: Left;    There were no vitals filed for this visit.  Subjective Assessment - 05/06/17 1448    Subjective  Doing well today.  Continues to ice daily.      Patient Stated Goals  improve walking and function of L knee    Currently in Pain?  Yes    Pain Score  2     Pain Location  Knee    Pain Orientation  Left    Pain Descriptors / Indicators  Burning    Pain Type  Surgical pain    Pain Onset  More than a month ago    Pain Frequency  Intermittent    Pain Relieving Factors  ice     Multiple Pain Sites  No                      [facility] Adult PT Treatment/Exercise - 05/06/17 1456      Self-Care   Self-Care  Other Self-Care Comments    Other Self-Care Comments   Instruction on scar massage with lotion for improved comfort with ROM; pt. encouraged to perform this x  2 min daily       Knee/Hip Exercises: Stretches   Lobbyist  Left;30 seconds;1 rep    Lobbyist Limitations  strap       Knee/Hip Exercises: Aerobic   Recumbent Bike  no resistance - for ROM - 1 full revolution with increased L knee pain following x 1 min       Knee/Hip Exercises: Standing   Terminal Knee Extension  Left;10 reps;Theraband 2 sets     Theraband Level (Terminal Knee Extension)  Level 4 (Blue)    Terminal Knee Extension Limitations  2nd set with ball squeeze on wall     Step Down  Left;10 reps;Step Height: 4";Hand Hold: 1    Step Down Limitations  focusing on eccentric control     Functional Squat  10 reps;3 seconds    Functional Squat Limitations  counter  complaint of increased R knee pain       Knee/Hip Exercises: Seated   Long [employer]  Left;15 reps    Long [employer]  Limitations  3" hold; adduction ball squeeze     Other Seated Knee/Hip Exercises  L knee flexion stretch 5" x 5 reps with R LE assist     Hamstring Curl  Left;10 reps    Hamstring Limitations  red TB      Knee/Hip Exercises: Supine   Straight Leg Raises  Left;15 reps    Straight Leg Raises Limitations  1    Other Supine Knee/Hip Exercises  knee flexion stretch with strap and heels on peanut p-ball 5" x 10 reps       Modalities   Modalities  Vasopneumatic      Vasopneumatic   Number Minutes Vasopneumatic   10 minutes    Vasopnuematic Location   Knee L    Vasopneumatic Pressure  Medium    Vasopneumatic Temperature   coldest temp.              PT Education - 05/06/17 1821    Education provided  Yes    Education Details  prone quad stretch     Person(s) Educated  Patient    Methods  Explanation;Verbal cues;Handout;Tactile cues    Comprehension  Verbalized understanding;Returned demonstration;Verbal cues required;Need further instruction       PT Short Term Goals - 05/06/17 1450      PT SHORT TERM GOAL #1   Title  patient to be independent with initial HEP    Status  On-going       PT SHORT TERM GOAL #2   Title  patient to improve L knee AROM 0-110    Status  On-going        PT Long Term Goals - 05/06/17 1450      PT LONG TERM GOAL #1   Title  patient to be independent with advanced HEP    Status  On-going      PT LONG TERM GOAL #2   Title  patient to demonstrate L knee AROM 0-120    Status  On-going      PT LONG TERM GOAL #3   Title  patient to improve L LE strength to >/= 4+/5    Status  On-going      PT LONG TERM GOAL #4   Title  patient to demonstrate reciprocal gait pattern up/down 1 flight of stairs without evidence of instability    Status  On-going      PT LONG TERM GOAL #5   Title  patient to demonstrate L SLR without quad lag demonstrating increased strength    Status  On-going            Plan - 05/06/17 1450    Clinical Impression Statement  [name] doing well today.  Reports no issues with HEP and performing these and previous HEP from home health therapist daily.  Most difficulty today with counter squat reporting pain in R knee > L knee.  Able to demo good overall control with 4" step-down today and will plan to progress strengthening therex per response to today's gentle strengthening activities.  Able to make full revolution on bike today with much L knee pain following x 1 min, which recovered.  Ended with ice/compression to L knee to reduce post-exercise swelling and pain.    PT Treatment/Interventions  ADLs/Self Care Home Management;Cryotherapy;Electrical Stimulation;Iontophoresis 4mg /ml Dexamethasone;Moist Heat;Therapeutic exercise;Therapeutic activities;Functional mobility training;Stair training;Gait training;DME Instruction;Ultrasound;Balance training;Neuromuscular re-education;Patient/family education;Manual techniques;Vasopneumatic Device;Taping;Energy conservation;Dry needling;Passive range of motion    Consulted and Agree with Plan of Care  Patient  Patient will benefit from skilled therapeutic intervention in order  to improve the following deficits and impairments:  Abnormal gait, Decreased activity tolerance, Decreased balance, Decreased range of motion, Decreased mobility, Difficulty walking, Pain, Decreased strength  Visit Diagnosis: Acute pain of left knee  Stiffness of left knee, not elsewhere classified  Difficulty in walking, not elsewhere classified  Other symptoms and signs involving the musculoskeletal system     Problem List Patient Active Problem List   Diagnosis Date Noted  . S/P total knee replacement using cement, left 03/30/2017  . Pain in right hip 03/17/2017  . Unilateral primary osteoarthritis, left knee 03/17/2017  . Unilateral primary osteoarthritis, right hip 03/17/2017    Kermit BaloMicah Kiley Torrence, PTA 05/06/17 6:22 PM  [facility] [address]  [address] [place], [place], [address] Phone: [phone]   Fax:  [phone]  Name: Stacey Bryant MRN: [id number] Date of Birth: 1946-02-22

## 2017-05-11 ENCOUNTER — Ambulatory Visit: Payer: Medicare Other | Admitting: Physical Therapy

## 2017-05-11 ENCOUNTER — Encounter: Payer: Self-pay | Admitting: Physical Therapy

## 2017-05-11 DIAGNOSIS — M25562 Pain in left knee: Secondary | ICD-10-CM | POA: Diagnosis not present

## 2017-05-11 DIAGNOSIS — R262 Difficulty in walking, not elsewhere classified: Secondary | ICD-10-CM

## 2017-05-11 DIAGNOSIS — M25662 Stiffness of left knee, not elsewhere classified: Secondary | ICD-10-CM

## 2017-05-11 DIAGNOSIS — R29898 Other symptoms and signs involving the musculoskeletal system: Secondary | ICD-10-CM

## 2017-05-11 NOTE — Therapy (Signed)
[facility] [address]  [address] [place], [place], [address] Phone: [phone]   Fax:  [phone]  Physical Therapy Treatment  Patient Details  Name: Stacey LimingJoan Bryant MRN: [id number] Date of Birth: Jun 25, 1945 Referring Provider: Dr. Norlene CampbellPeter Whitfield   Encounter Date: 05/11/2017  PT End of Session - 05/11/17 1400    Visit Number  3    Number of Visits  12    Date for PT Re-Evaluation  06/15/17    PT Start Time  1357    PT Stop Time  1440    PT Time Calculation (min)  43 min    Activity Tolerance  Patient tolerated treatment well    Behavior During Therapy  [facility] for tasks assessed/performed       Past Medical History:  Diagnosis Date  . Arthritis   . GERD (gastroesophageal reflux disease)   . Headache    HX  MIGRAINES    Past Surgical History:  Procedure Laterality Date  . JOINT REPLACEMENT    . KNEE ARTHROSCOPY     BILATERAL   . 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 TOTAL KNEE ARTHROPLASTY Left 03/30/2017   Procedure: LEFT TOTAL KNEE ARTHROPLASTY;  Surgeon: Valeria BatmanWhitfield, Peter W, MD;  [facility];  Service: Orthopedics;  Laterality: Left;    There were no vitals filed for this visit.  Subjective Assessment - 05/11/17 1359    Subjective  uses cane for safety in community    Patient Stated Goals  improve walking and function of L knee    Currently in Pain?  Yes    Pain Score  2     Pain Location  Knee    Pain Orientation  Left    Pain Descriptors / Indicators  Tightness;Aching    Pain Type  Acute pain;Surgical pain                      [facility] Adult PT Treatment/Exercise - 05/11/17 1400      Knee/Hip Exercises: Stretches   Gastroc Stretch  Left;3 reps;30 seconds      Knee/Hip Exercises: Aerobic   Recumbent Bike  L1 x 6 min - full revolutions      Knee/Hip Exercises: Standing   Heel Raises  Both;15 reps negative    Terminal Knee Extension  Strengthening;Left;15 reps;Theraband     Theraband Level (Terminal Knee Extension)  Level 4 (Blue)    Hip Abduction  Stengthening;Left;15 reps;Knee straight red tband    Hip Extension  Stengthening;Left;15 reps;Knee straight red tband      Knee/Hip Exercises: Seated   Long Arc Quad  Strengthening;Left;15 reps;Weights    Long Arc Quad Weight  2 lbs.    Other Seated Knee/Hip Exercises  fitter - 2 blue bands - L LE x 15 reps    Hamstring Curl  Strengthening;Left;15 reps    Hamstring Limitations  red TB      Knee/Hip Exercises: Supine   Straight Leg Raises  Strengthening;Left;15 reps 1#    Straight Leg Raise with External Rotation  Strengthening;Left;15 reps 1#    Other Supine Knee/Hip Exercises  B HS bridge x 15 reps               PT Short Term Goals - 05/06/17 1450      PT SHORT TERM GOAL #1   Title  patient to be independent with initial HEP    Status  On-going      PT SHORT  TERM GOAL #2   Title  patient to improve L knee AROM 0-110    Status  On-going        PT Long Term Goals - 05/06/17 1450      PT LONG TERM GOAL #1   Title  patient to be independent with advanced HEP    Status  On-going      PT LONG TERM GOAL #2   Title  patient to demonstrate L knee AROM 0-120    Status  On-going      PT LONG TERM GOAL #3   Title  patient to improve L LE strength to >/= 4+/5    Status  On-going      PT LONG TERM GOAL #4   Title  patient to demonstrate reciprocal gait pattern up/down 1 flight of stairs without evidence of instability    Status  On-going      PT LONG TERM GOAL #5   Title  patient to demonstrate L SLR without quad lag demonstrating increased strength    Status  On-going            Plan - 05/11/17 1400    Clinical Impression Statement  Tolerable to all strengthening work today. Encouraged with progress with improved gait mechanics and ability to complete full revolutions with relatively no pain. Patient to bring in HEP at next visit - hopeful to reduce number of ther ex activities and  progress to more functional based movements at next visit. Making good progress towards goals.     PT Treatment/Interventions  ADLs/Self Care Home Management;Cryotherapy;Electrical Stimulation;Iontophoresis 4mg /ml Dexamethasone;Moist Heat;Therapeutic exercise;Therapeutic activities;Functional mobility training;Stair training;Gait training;DME Instruction;Ultrasound;Balance training;Neuromuscular re-education;Patient/family education;Manual techniques;Vasopneumatic Device;Taping;Energy conservation;Dry needling;Passive range of motion    Consulted and Agree with Plan of Care  Patient       Patient will benefit from skilled therapeutic intervention in order to improve the following deficits and impairments:  Abnormal gait, Decreased activity tolerance, Decreased balance, Decreased range of motion, Decreased mobility, Difficulty walking, Pain, Decreased strength  Visit Diagnosis: Acute pain of left knee  Stiffness of left knee, not elsewhere classified  Difficulty in walking, not elsewhere classified  Other symptoms and signs involving the musculoskeletal system     Problem List Patient Active Problem List   Diagnosis Date Noted  . S/P total knee replacement using cement, left 03/30/2017  . Pain in right hip 03/17/2017  . Unilateral primary osteoarthritis, left knee 03/17/2017  . Unilateral primary osteoarthritis, right hip 03/17/2017     Kipp Laurence, PT, DPT 05/11/17 2:44 PM   [facility] [address]  [address] [place], [place], [address] Phone: [phone]   Fax:  [phone]  Name: Stacey Bryant MRN: [id number] Date of Birth: Apr 09, 1945

## 2017-05-14 ENCOUNTER — Other Ambulatory Visit (INDEPENDENT_AMBULATORY_CARE_PROVIDER_SITE_OTHER): Payer: Self-pay

## 2017-05-14 ENCOUNTER — Telehealth (INDEPENDENT_AMBULATORY_CARE_PROVIDER_SITE_OTHER): Payer: Self-pay | Admitting: Orthopaedic Surgery

## 2017-05-14 ENCOUNTER — Ambulatory Visit: Payer: Medicare Other | Attending: Orthopaedic Surgery

## 2017-05-14 DIAGNOSIS — M25662 Stiffness of left knee, not elsewhere classified: Secondary | ICD-10-CM | POA: Insufficient documentation

## 2017-05-14 DIAGNOSIS — R262 Difficulty in walking, not elsewhere classified: Secondary | ICD-10-CM | POA: Insufficient documentation

## 2017-05-14 DIAGNOSIS — M25562 Pain in left knee: Secondary | ICD-10-CM | POA: Diagnosis present

## 2017-05-14 DIAGNOSIS — R29898 Other symptoms and signs involving the musculoskeletal system: Secondary | ICD-10-CM

## 2017-05-14 MED ORDER — CLINDAMYCIN HCL 300 MG PO CAPS: ORAL_CAPSULE | ORAL | 0 refills | Status: DC

## 2017-05-14 NOTE — Telephone Encounter (Signed)
Please call her to let her know when she can fly.

## 2017-05-14 NOTE — Telephone Encounter (Signed)
Sent in antibiotic, notified patient to pick up at pharmacy.

## 2017-05-14 NOTE — Therapy (Addendum)
[facility] [address]  [address] [place], [place], [address] Phone: [phone]   Fax:  [phone]  Physical Therapy Treatment  Patient Details  Name: Stacey Bryant MRN: [id number] Date of Birth: 1945-05-11 Referring Provider: Dr. Norlene Campbell   Encounter Date: 05/14/2017  PT End of Session - 05/14/17 1017    Visit Number  4    Number of Visits  12    Date for PT Re-Evaluation  06/15/17    PT Start Time  1013    PT Stop Time  1056    PT Time Calculation (min)  43 min    Activity Tolerance  Patient tolerated treatment well    Behavior During Therapy  [facility] for tasks assessed/performed       Past Medical History:  Diagnosis Date  . Arthritis   . GERD (gastroesophageal reflux disease)   . Headache    HX  MIGRAINES    Past Surgical History:  Procedure Laterality Date  . JOINT REPLACEMENT    . KNEE ARTHROSCOPY     BILATERAL   . 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 TOTAL KNEE ARTHROPLASTY Left 03/30/2017   Procedure: LEFT TOTAL KNEE ARTHROPLASTY;  Surgeon: Valeria Batman, MD;  Location: [facility] OR;  Service: Orthopedics;  Laterality: Left;    There were no vitals filed for this visit.  Subjective Assessment - 05/14/17 1015    Subjective  Reports some stiffness today however attributes this to the weather.      Patient Stated Goals  improve walking and function of L knee    Currently in Pain?  Yes    Pain Score  2     Pain Location  Knee    Pain Orientation  Left    Pain Descriptors / Indicators  Tightness    Pain Type  Acute pain;Surgical pain    Pain Onset  More than a month ago    Pain Frequency  Intermittent    Aggravating Factors   forced flexion     Pain Relieving Factors  ice     Multiple Pain Sites  No         [facility] PT Assessment - 05/14/17 1055      AROM   Left Knee Extension  3    Left Knee Flexion  107                  [facility] Adult PT Treatment/Exercise - 05/14/17 1029       Self-Care   Self-Care  Other Self-Care Comments    Other Self-Care Comments   Discussed previously issued HEP with pt. to check for understanding       Knee/Hip Exercises: Stretches   Theme park manager  Left;3 reps;30 seconds    Gastroc Stretch Limitations  wall and prostretch       Knee/Hip Exercises: Aerobic   Recumbent Bike  L1 x 6 min - full revolutions      Knee/Hip Exercises: Machines for Strengthening   Cybex Knee Flexion  15# x 15 reps       Knee/Hip Exercises: Standing   Knee Flexion  Left;15 reps    Knee Flexion Limitations  counter     Hip Flexion  Right;Left;10 reps;Knee bent    Hip Flexion Limitations  2#     Terminal Knee Extension  Strengthening;Left;15 reps;Theraband    Theraband Level (Terminal Knee Extension)  Level 4 (Blue)    Lateral Step Up  Left;10 reps;Step Height:  6";Step Height: 2"    Lateral Step Up Limitations  2 ski poles     Forward Step Up  Left;10 reps;Step Height: 6";Hand Hold: 2    Forward Step Up Limitations  6" step     Functional Squat  15 reps;3 seconds    Functional Squat Limitations  TRX             PT Education - 05/14/17 1209    Education provided  Yes    Education Details  blue TB issued to pt. for standing TKE with band closed in door    Person(s) Educated  Patient    Methods  Explanation;Demonstration;Verbal cues;Handout    Comprehension  Verbalized understanding;Returned demonstration;Verbal cues required;Need further instruction       PT Short Term Goals - 05/14/17 1052      PT SHORT TERM GOAL #1   Title  patient to be independent with initial HEP    Status  Achieved      PT SHORT TERM GOAL #2   Title  patient to improve L knee AROM 0-110    Status  On-going 3-107 dg         PT Long Term Goals - 05/06/17 1450      PT LONG TERM GOAL #1   Title  patient to be independent with advanced HEP    Status  On-going      PT LONG TERM GOAL #2   Title  patient to demonstrate L knee AROM 0-120    Status  On-going       PT LONG TERM GOAL #3   Title  patient to improve L LE strength to >/= 4+/5    Status  On-going      PT LONG TERM GOAL #4   Title  patient to demonstrate reciprocal gait pattern up/down 1 flight of stairs without evidence of instability    Status  On-going      PT LONG TERM GOAL #5   Title  patient to demonstrate L SLR without quad lag demonstrating increased strength    Status  On-going            Plan - 05/14/17 1018    Clinical Impression Statement  Stacey Bryant doing well today however reporting increased L knee "stiffness" which she attributes to weather change.  Tolerated all strengthening and ROM activities in treatment well today.  Did require some cueing for proper technique with stepping activities today to ensure eccentric quad strengthening.  Progressing well toward goals.      PT Treatment/Interventions  ADLs/Self Care Home Management;Cryotherapy;Electrical Stimulation;Iontophoresis 4mg /ml Dexamethasone;Moist Heat;Therapeutic exercise;Therapeutic activities;Functional mobility training;Stair training;Gait training;DME Instruction;Ultrasound;Balance training;Neuromuscular re-education;Patient/family education;Manual techniques;Vasopneumatic Device;Taping;Energy conservation;Dry needling;Passive range of motion    Consulted and Agree with Plan of Care  Patient       Patient will benefit from skilled therapeutic intervention in order to improve the following deficits and impairments:  Abnormal gait, Decreased activity tolerance, Decreased balance, Decreased range of motion, Decreased mobility, Difficulty walking, Pain, Decreased strength  Visit Diagnosis: Acute pain of left knee  Stiffness of left knee, not elsewhere classified  Difficulty in walking, not elsewhere classified  Other symptoms and signs involving the musculoskeletal system     Problem List Patient Active Problem List   Diagnosis Date Noted  . S/P total knee replacement using cement, left 03/30/2017  .  Pain in right hip 03/17/2017  . Unilateral primary osteoarthritis, left knee 03/17/2017  . Unilateral primary osteoarthritis, right hip 03/17/2017  Kermit BaloMicah Roch Quach, PTA 05/14/17 12:12 PM  [facility] [place] [address]  [address] [place], [place], [address] Phone: [phone]   Fax:  [phone]  Name: Stacey Bryant MRN: [id number] Date of Birth: 1945/04/12

## 2017-05-14 NOTE — Telephone Encounter (Signed)
Called PW AND HE GAVE OK.

## 2017-05-14 NOTE — Telephone Encounter (Signed)
Patient called stating that she had a TKR on 1/15 and has a dentist appointment scheduled for the end of this month for her cleaning.  Patient wants to know if she can keep this appointment or should she reschedule for a later date.  Patient also wants to know how long she needs to wait before she can fly.

## 2017-05-17 NOTE — Telephone Encounter (Signed)
Ok to fly anytime

## 2017-05-17 NOTE — Telephone Encounter (Signed)
Called and notified pt she is able to fly per Dr. Cleophas DunkerWhitfield

## 2017-05-18 ENCOUNTER — Ambulatory Visit: Payer: Medicare Other | Admitting: Physical Therapy

## 2017-05-18 ENCOUNTER — Encounter: Payer: Self-pay | Admitting: Physical Therapy

## 2017-05-18 DIAGNOSIS — M25562 Pain in left knee: Secondary | ICD-10-CM

## 2017-05-18 DIAGNOSIS — R262 Difficulty in walking, not elsewhere classified: Secondary | ICD-10-CM

## 2017-05-18 DIAGNOSIS — R29898 Other symptoms and signs involving the musculoskeletal system: Secondary | ICD-10-CM

## 2017-05-18 DIAGNOSIS — M25662 Stiffness of left knee, not elsewhere classified: Secondary | ICD-10-CM

## 2017-05-18 NOTE — Therapy (Signed)
[facility] [address]  [address] [place], [place], [address] Phone: [phone]   Fax:  [phone]  Physical Therapy Treatment  Patient Details  Name: Stacey LimingJoan Bryant MRN: [id number] Date of Birth: 07-03-1945 Referring Provider: Dr. Norlene CampbellPeter Whitfield   Encounter Date: 05/18/2017  PT End of Session - 05/18/17 1409    Visit Number  5    Number of Visits  12    Date for PT Re-Evaluation  06/15/17    PT Start Time  1407    PT Stop Time  1446    PT Time Calculation (min)  39 min    Activity Tolerance  Patient tolerated treatment well    Behavior During Therapy  [facility] for tasks assessed/performed       Past Medical History:  Diagnosis Date  . Arthritis   . GERD (gastroesophageal reflux disease)   . Headache    HX  MIGRAINES    Past Surgical History:  Procedure Laterality Date  . JOINT REPLACEMENT    . KNEE ARTHROSCOPY     BILATERAL   . 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 TOTAL KNEE ARTHROPLASTY Left 03/30/2017   Procedure: LEFT TOTAL KNEE ARTHROPLASTY;  Surgeon: Valeria BatmanWhitfield, Peter W, MD;  [facility];  Service: Orthopedics;  Laterality: Left;    There were no vitals filed for this visit.  Subjective Assessment - 05/18/17 1408    Subjective  doing well - just stiff    Patient Stated Goals  improve walking and function of L knee    Currently in Pain?  Yes    Pain Score  1     Pain Location  Knee    Pain Orientation  Left    Pain Descriptors / Indicators  Tightness    Pain Type  Acute pain;Surgical pain                      [facility] Adult PT Treatment/Exercise - 05/18/17 1410      Knee/Hip Exercises: Aerobic   Recumbent Bike  L2 x 6 min      Knee/Hip Exercises: Machines for Strengthening   Cybex Leg Press  B LE- 15# x 15      Knee/Hip Exercises: Standing   Step Down  Left;Hand Hold: 2;Step Height: 4" 12 reps    Functional Squat  15 reps TRX - mini due to R knee pain    Wall Squat  -- 12 reps  - limited depth due to R knee pain    Other Standing Knee Exercises  B side stepping - red tband x 20 feet each direction      Knee/Hip Exercises: Seated   Long Arc Quad  Strengthening;Left;15 reps;Weights    Long Arc Quad Weight  3 lbs.    Long [employer] Quad Limitations  + ball squeeze    Other Seated Knee/Hip Exercises  fitter - 1 black/1blue - L LE x 15 reps    Hamstring Curl  Strengthening;Left;15 reps    Hamstring Limitations  green tband               PT Short Term Goals - 05/14/17 1052      PT SHORT TERM GOAL #1   Title  patient to be independent with initial HEP    Status  Achieved      PT SHORT TERM GOAL #2   Title  patient to improve L knee AROM 0-110    Status  On-going  3-107 dg         PT Long Term Goals - 05/06/17 1450      PT LONG TERM GOAL #1   Title  patient to be independent with advanced HEP    Status  On-going      PT LONG TERM GOAL #2   Title  patient to demonstrate L knee AROM 0-120    Status  On-going      PT LONG TERM GOAL #3   Title  patient to improve L LE strength to >/= 4+/5    Status  On-going      PT LONG TERM GOAL #4   Title  patient to demonstrate reciprocal gait pattern up/down 1 flight of stairs without evidence of instability    Status  On-going      PT LONG TERM GOAL #5   Title  patient to demonstrate L SLR without quad lag demonstrating increased strength    Status  On-going            Plan - 05/18/17 1409    Clinical Impression Statement  Feeling well - good progress with strength and AROM at L knee. TOlerable to all increases in strenghtening today, however, some consideration needed for R knee due to pain. Making good progress towards goals.     PT Treatment/Interventions  ADLs/Self Care Home Management;Cryotherapy;Electrical Stimulation;Iontophoresis 4mg /ml Dexamethasone;Moist Heat;Therapeutic exercise;Therapeutic activities;Functional mobility training;Stair training;Gait training;DME Instruction;Ultrasound;Balance  training;Neuromuscular re-education;Patient/family education;Manual techniques;Vasopneumatic Device;Taping;Energy conservation;Dry needling;Passive range of motion    Consulted and Agree with Plan of Care  Patient       Patient will benefit from skilled therapeutic intervention in order to improve the following deficits and impairments:  Abnormal gait, Decreased activity tolerance, Decreased balance, Decreased range of motion, Decreased mobility, Difficulty walking, Pain, Decreased strength  Visit Diagnosis: Acute pain of left knee  Stiffness of left knee, not elsewhere classified  Difficulty in walking, not elsewhere classified  Other symptoms and signs involving the musculoskeletal system     Problem List Patient Active Problem List   Diagnosis Date Noted  . S/P total knee replacement using cement, left 03/30/2017  . Pain in right hip 03/17/2017  . Unilateral primary osteoarthritis, left knee 03/17/2017  . Unilateral primary osteoarthritis, right hip 03/17/2017     Kipp Laurence, PT, DPT 05/18/17 4:46 PM   [facility] [address]  [address] [place], [place], [address] Phone: [phone]   Fax:  [phone]  Name: Stacey Bryant MRN: [id number] Date of Birth: [birth year]

## 2017-05-21 ENCOUNTER — Ambulatory Visit: Payer: Medicare Other

## 2017-05-21 DIAGNOSIS — R262 Difficulty in walking, not elsewhere classified: Secondary | ICD-10-CM

## 2017-05-21 DIAGNOSIS — R29898 Other symptoms and signs involving the musculoskeletal system: Secondary | ICD-10-CM

## 2017-05-21 DIAGNOSIS — M25562 Pain in left knee: Secondary | ICD-10-CM | POA: Diagnosis not present

## 2017-05-21 DIAGNOSIS — M25662 Stiffness of left knee, not elsewhere classified: Secondary | ICD-10-CM

## 2017-05-21 NOTE — Therapy (Signed)
[facility] [place] [address]  [address] [place], [place], [address] Phone: [phone]   Fax:  [phone]  Physical Therapy Treatment  Patient Details  Name: Stacey LimingJoan Bryant MRN: [id number] Date of Birth: 02-12-1946 Referring Provider: Dr. Norlene CampbellPeter Whitfield   Encounter Date: 05/21/2017  PT End of Session - 05/21/17 1020    Visit Number  6    Number of Visits  12    Date for PT Re-Evaluation  06/15/17    PT Start Time  1017    PT Stop Time  1058    PT Time Calculation (min)  41 min    Activity Tolerance  Patient tolerated treatment well    Behavior During Therapy  [facility] for tasks assessed/performed       Past Medical History:  Diagnosis Date  . Arthritis   . GERD (gastroesophageal reflux disease)   . Headache    HX  MIGRAINES    Past Surgical History:  Procedure Laterality Date  . JOINT REPLACEMENT    . KNEE ARTHROSCOPY     BILATERAL   . 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 TOTAL KNEE ARTHROPLASTY Left 03/30/2017   Procedure: LEFT TOTAL KNEE ARTHROPLASTY;  Surgeon: Valeria BatmanWhitfield, Peter W, MD;  [facility];  Service: Orthopedics;  Laterality: Left;    There were no vitals filed for this visit.  Subjective Assessment - 05/21/17 1021    Subjective  Pt. reporting increased L knee stiffness today.  Felt good following last visit.      Patient Stated Goals  improve walking and function of L knee    Currently in Pain?  Yes    Pain Score  1     Pain Location  Knee    Pain Orientation  Left    Pain Descriptors / Indicators  Tightness    Pain Type  Acute pain    Pain Onset  More than a month ago    Pain Frequency  Intermittent    Aggravating Factors   Forced flexion     Pain Relieving Factors  ice     Multiple Pain Sites  No                      [facility] Adult PT Treatment/Exercise - 05/21/17 1023      Knee/Hip Exercises: Stretches   LobbyistQuad Stretch  Left;30 seconds;1 rep some cueing required for proper  positioning     Quad Stretch Limitations  strap       Knee/Hip Exercises: Aerobic   Nustep  Lvl 4, 6 min       Knee/Hip Exercises: Machines for Strengthening   Cybex Knee Flexion  B LE's: 20# x 10 reps; B con/L ecc 10# x 10 reps     Cybex Leg Press  B LE- 15# x 20 reps       Knee/Hip Exercises: Standing   Heel Raises  Both;20 reps    Heel Raises Limitations  3" eccentric     Forward Step Up  Left;Step Height: 6";15 reps;Hand Hold: 0    Forward Step Up Limitations  Cueing for upright posture    Step Down  15 reps;Left;Hand Hold: 2;Step Height: 4"    Step Down Limitations  cues for wt. on L heel    Functional Squat  15 reps    Functional Squat Limitations  TRX - 3" hold     SLS  L SLS 2 x 20 sec; counter  Knee/Hip Exercises: Seated   Sit to Sand  10 reps;with UE support from airex pad on mat table; focusing on not using momentum               PT Short Term Goals - 05/14/17 1052      PT SHORT TERM GOAL #1   Title  patient to be independent with initial HEP    Status  Achieved      PT SHORT TERM GOAL #2   Title  patient to improve L knee AROM 0-110    Status  On-going 3-107 dg         PT Long Term Goals - 05/06/17 1450      PT LONG TERM GOAL #1   Title  patient to be independent with advanced HEP    Status  On-going      PT LONG TERM GOAL #2   Title  patient to demonstrate L knee AROM 0-120    Status  On-going      PT LONG TERM GOAL #3   Title  patient to improve L LE strength to >/= 4+/5    Status  On-going      PT LONG TERM GOAL #4   Title  patient to demonstrate reciprocal gait pattern up/down 1 flight of stairs without evidence of instability    Status  On-going      PT LONG TERM GOAL #5   Title  patient to demonstrate L SLR without quad lag demonstrating increased strength    Status  On-going            Plan - 05/21/17 1020    Clinical Impression Statement  Charlane reporting increased L knee stiffness today without known trigger.   Tolerated progression of stepping, heel raise, and addition of L SLS balance well today.  Cueing required today for upright posture and to encourage pt. regarding current progress.  Will continue to progress toward goals.    PT Treatment/Interventions  ADLs/Self Care Home Management;Cryotherapy;Electrical Stimulation;Iontophoresis 4mg /ml Dexamethasone;Moist Heat;Therapeutic exercise;Therapeutic activities;Functional mobility training;Stair training;Gait training;DME Instruction;Ultrasound;Balance training;Neuromuscular re-education;Patient/family education;Manual techniques;Vasopneumatic Device;Taping;Energy conservation;Dry needling;Passive range of motion    Consulted and Agree with Plan of Care  Patient       Patient will benefit from skilled therapeutic intervention in order to improve the following deficits and impairments:  Abnormal gait, Decreased activity tolerance, Decreased balance, Decreased range of motion, Decreased mobility, Difficulty walking, Pain, Decreased strength  Visit Diagnosis: Acute pain of left knee  Stiffness of left knee, not elsewhere classified  Difficulty in walking, not elsewhere classified  Other symptoms and signs involving the musculoskeletal system     Problem List Patient Active Problem List   Diagnosis Date Noted  . S/P total knee replacement using cement, left 03/30/2017  . Pain in right hip 03/17/2017  . Unilateral primary osteoarthritis, left knee 03/17/2017  . Unilateral primary osteoarthritis, right hip 03/17/2017    Kermit Balo, PTA 05/21/17 11:07 AM  [facility] [address]  [address] [place], [place], [address] Phone: [phone]   Fax:  [phone]  Name: Stacey Bryant MRN: [id number] Date of Birth: 11-Jan-1946

## 2017-05-25 ENCOUNTER — Encounter: Payer: Self-pay | Admitting: Physical Therapy

## 2017-05-25 ENCOUNTER — Ambulatory Visit: Payer: Medicare Other | Admitting: Physical Therapy

## 2017-05-25 DIAGNOSIS — R262 Difficulty in walking, not elsewhere classified: Secondary | ICD-10-CM

## 2017-05-25 DIAGNOSIS — M25562 Pain in left knee: Secondary | ICD-10-CM | POA: Diagnosis not present

## 2017-05-25 DIAGNOSIS — R29898 Other symptoms and signs involving the musculoskeletal system: Secondary | ICD-10-CM

## 2017-05-25 DIAGNOSIS — M25662 Stiffness of left knee, not elsewhere classified: Secondary | ICD-10-CM

## 2017-05-25 NOTE — Therapy (Signed)
[facility] Outpatient Rehabilitation [facility] [address]  [address] [place], [place], [address] Phone: [phone]   Fax:  [phone]  Physical Therapy Treatment  Patient Details  Name: Stacey Bryant MRN: [id number] Date of Birth: 29-May-1945 Referring Provider: Dr. Norlene Campbell   Encounter Date: 05/25/2017  PT End of Session - 05/25/17 1410    Visit Number  7    Number of Visits  12    Date for PT Re-Evaluation  06/15/17    PT Start Time  1407    PT Stop Time  1445    PT Time Calculation (min)  38 min    Activity Tolerance  Patient tolerated treatment well    Behavior During Therapy  [facility] for tasks assessed/performed       Past Medical History:  Diagnosis Date  . Arthritis   . GERD (gastroesophageal reflux disease)   . Headache    HX  MIGRAINES    Past Surgical History:  Procedure Laterality Date  . JOINT REPLACEMENT    . KNEE ARTHROSCOPY     BILATERAL   . 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 TOTAL KNEE ARTHROPLASTY Left 03/30/2017   Procedure: LEFT TOTAL KNEE ARTHROPLASTY;  Surgeon: Valeria Batman, MD;  Location: [facility] OR;  Service: Orthopedics;  Laterality: Left;    There were no vitals filed for this visit.  Subjective Assessment - 05/25/17 1409    Subjective  had pain after last session - hard session; difficulty sleeping last night    Patient Stated Goals  improve walking and function of L knee    Currently in Pain?  Yes    Pain Score  3     Pain Location  Knee    Pain Orientation  Left    Pain Descriptors / Indicators  Aching;Sore;Tightness    Pain Type  Acute pain;Surgical pain                      [facility] Adult PT Treatment/Exercise - 05/25/17 1411      Knee/Hip Exercises: Aerobic   Recumbent Bike  L2 x 8 min      Knee/Hip Exercises: Standing   Heel Raises  Both;15 reps negative    Hip Flexion  Stengthening;Left;15 reps;Knee straight    Hip Flexion Limitations  red tband    Hip Abduction   Stengthening;Left;15 reps;Knee straight    Abduction Limitations  red tband    Hip Extension  Stengthening;Left;15 reps;Knee straight    Extension Limitations  red tband    Forward Step Up  Left;15 reps;Hand Hold: 0;Step Height: 8"    Wall Squat  15 reps mini - orange pball at wall    Other Standing Knee Exercises  L LE - fitter hip extension - 2 blue bands x 15      Knee/Hip Exercises: Seated   Other Seated Knee/Hip Exercises  fitter - 1 black/1blue - L LE x 15 reps               PT Short Term Goals - 05/14/17 1052      PT SHORT TERM GOAL #1   Title  patient to be independent with initial HEP    Status  Achieved      PT SHORT TERM GOAL #2   Title  patient to improve L knee AROM 0-110    Status  On-going 3-107 dg         PT Long Term Goals - 05/06/17 1450  PT LONG TERM GOAL #1   Title  patient to be independent with advanced HEP    Status  On-going      PT LONG TERM GOAL #2   Title  patient to demonstrate L knee AROM 0-120    Status  On-going      PT LONG TERM GOAL #3   Title  patient to improve L LE strength to >/= 4+/5    Status  On-going      PT LONG TERM GOAL #4   Title  patient to demonstrate reciprocal gait pattern up/down 1 flight of stairs without evidence of instability    Status  On-going      PT LONG TERM GOAL #5   Title  patient to demonstrate L SLR without quad lag demonstrating increased strength    Status  On-going            Plan - 05/25/17 1410    Clinical Impression Statement  Some increased pain and soreness at L knee/quad today - encouraged patient that she is still healing and progressing functional mobility, and may experience pain and tightness intermittently within the next few months. Tolerable to all strength training with much improved step up today.     PT Treatment/Interventions  ADLs/Self Care Home Management;Cryotherapy;Electrical Stimulation;Iontophoresis 4mg /ml Dexamethasone;Moist Heat;Therapeutic  exercise;Therapeutic activities;Functional mobility training;Stair training;Gait training;DME Instruction;Ultrasound;Balance training;Neuromuscular re-education;Patient/family education;Manual techniques;Vasopneumatic Device;Taping;Energy conservation;Dry needling;Passive range of motion    Consulted and Agree with Plan of Care  Patient       Patient will benefit from skilled therapeutic intervention in order to improve the following deficits and impairments:  Abnormal gait, Decreased activity tolerance, Decreased balance, Decreased range of motion, Decreased mobility, Difficulty walking, Pain, Decreased strength  Visit Diagnosis: Acute pain of left knee  Stiffness of left knee, not elsewhere classified  Difficulty in walking, not elsewhere classified  Other symptoms and signs involving the musculoskeletal system     Problem List Patient Active Problem List   Diagnosis Date Noted  . S/P total knee replacement using cement, left 03/30/2017  . Pain in right hip 03/17/2017  . Unilateral primary osteoarthritis, left knee 03/17/2017  . Unilateral primary osteoarthritis, right hip 03/17/2017     Kipp LaurenceStephanie R Kameryn Tisdel, PT, DPT 05/25/17 5:15 PM   [facility] [address]  [address] [place], [place], [address] Phone: [phone]   Fax:  [phone]  Name: Stacey LimingJoan Bryant MRN: [id number] Date of Birth: [birth year]

## 2017-05-28 ENCOUNTER — Ambulatory Visit: Payer: Medicare Other | Admitting: Physical Therapy

## 2017-05-28 ENCOUNTER — Encounter: Payer: Self-pay | Admitting: Physical Therapy

## 2017-05-28 DIAGNOSIS — M25662 Stiffness of left knee, not elsewhere classified: Secondary | ICD-10-CM

## 2017-05-28 DIAGNOSIS — M25562 Pain in left knee: Secondary | ICD-10-CM | POA: Diagnosis not present

## 2017-05-28 DIAGNOSIS — R29898 Other symptoms and signs involving the musculoskeletal system: Secondary | ICD-10-CM

## 2017-05-28 DIAGNOSIS — R262 Difficulty in walking, not elsewhere classified: Secondary | ICD-10-CM

## 2017-05-28 NOTE — Therapy (Signed)
[facility] Outpatient Rehabilitation [facility] [address]  [address] [place], [place], [address] Phone: [phone]   Fax:  [phone]  Physical Therapy Treatment  Patient Details  Name: Stacey Bryant MRN: [id number] Date of Birth: 1945/06/20 Referring Provider: Dr. Norlene Campbell   Encounter Date: 05/28/2017  PT End of Session - 05/28/17 1104    Visit Number  8    Number of Visits  12    Date for PT Re-Evaluation  06/15/17    PT Start Time  1100    PT Stop Time  1140    PT Time Calculation (min)  40 min    Activity Tolerance  Patient tolerated treatment well    Behavior During Therapy  [facility] for tasks assessed/performed       Past Medical History:  Diagnosis Date  . Arthritis   . GERD (gastroesophageal reflux disease)   . Headache    HX  MIGRAINES    Past Surgical History:  Procedure Laterality Date  . JOINT REPLACEMENT    . KNEE ARTHROSCOPY     BILATERAL   . 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 TOTAL KNEE ARTHROPLASTY Left 03/30/2017   Procedure: LEFT TOTAL KNEE ARTHROPLASTY;  Surgeon: Valeria Batman, MD;  Location: [facility] OR;  Service: Orthopedics;  Laterality: Left;    There were no vitals filed for this visit.  Subjective Assessment - 05/28/17 1102    Subjective  feeling well - no new complaints    Patient Stated Goals  improve walking and function of L knee    Currently in Pain?  Yes    Pain Score  1     Pain Location  Knee    Pain Orientation  Left    Pain Descriptors / Indicators  Aching;Sore    Pain Type  Acute pain;Surgical pain         [facility] PT Assessment - 05/28/17 0001      AROM   Left Knee Extension  1    Left Knee Flexion  114                  [facility] Adult PT Treatment/Exercise - 05/28/17 1105      Knee/Hip Exercises: Aerobic   Recumbent Bike  L2 x 6 min      Knee/Hip Exercises: Standing   Lateral Step Up  15 reps;Hand Hold: 0;Step Height: 8"    Functional Squat  15 reps    Functional Squat  Limitations  TRX - mini    SLS  L SLS 2 x 20 sec - minimal UE assist; B tandem stance x 20 sec - no assist      Knee/Hip Exercises: Seated   Other Seated Knee/Hip Exercises  fitter - 1 black/1blue - L LE x 15 reps    Hamstring Curl  Strengthening;Left;15 reps    Hamstring Limitations  green tband      Knee/Hip Exercises: Supine   Bridges  Strengthening;Both;15 reps    Straight Leg Raises  Strengthening;Left;15 reps 2#    Straight Leg Raise with External Rotation  Strengthening;Left;15 reps 2#               PT Short Term Goals - 05/14/17 1052      PT SHORT TERM GOAL #1   Title  patient to be independent with initial HEP    Status  Achieved      PT SHORT TERM GOAL #2   Title  patient to improve L knee  AROM 0-110    Status  On-going 3-107 dg         PT Long Term Goals - 05/06/17 1450      PT LONG TERM GOAL #1   Title  patient to be independent with advanced HEP    Status  On-going      PT LONG TERM GOAL #2   Title  patient to demonstrate L knee AROM 0-120    Status  On-going      PT LONG TERM GOAL #3   Title  patient to improve L LE strength to >/= 4+/5    Status  On-going      PT LONG TERM GOAL #4   Title  patient to demonstrate reciprocal gait pattern up/down 1 flight of stairs without evidence of instability    Status  On-going      PT LONG TERM GOAL #5   Title  patient to demonstrate L SLR without quad lag demonstrating increased strength    Status  On-going            Plan - 05/28/17 1104    Clinical Impression Statement  Feeling better today + sleeping better. Patient tolerable to all strengthening work with no increase in pain. Patient seemingly only limited due to R knee pain with ther ex. WIll continue to progress towards goals.     PT Treatment/Interventions  ADLs/Self Care Home Management;Cryotherapy;Electrical Stimulation;Iontophoresis 4mg /ml Dexamethasone;Moist Heat;Therapeutic exercise;Therapeutic activities;Functional mobility  training;Stair training;Gait training;DME Instruction;Ultrasound;Balance training;Neuromuscular re-education;Patient/family education;Manual techniques;Vasopneumatic Device;Taping;Energy conservation;Dry needling;Passive range of motion    Consulted and Agree with Plan of Care  Patient       Patient will benefit from skilled therapeutic intervention in order to improve the following deficits and impairments:  Abnormal gait, Decreased activity tolerance, Decreased balance, Decreased range of motion, Decreased mobility, Difficulty walking, Pain, Decreased strength  Visit Diagnosis: Acute pain of left knee  Stiffness of left knee, not elsewhere classified  Difficulty in walking, not elsewhere classified  Other symptoms and signs involving the musculoskeletal system     Problem List Patient Active Problem List   Diagnosis Date Noted  . S/P total knee replacement using cement, left 03/30/2017  . Pain in right hip 03/17/2017  . Unilateral primary osteoarthritis, left knee 03/17/2017  . Unilateral primary osteoarthritis, right hip 03/17/2017     Kipp LaurenceStephanie R Ikran Patman, PT, DPT 05/28/17 11:54 AM   [facility] [address]  [address] [place], [place], [address] Phone: [phone]   Fax:  [phone]  Name: Stacey LimingJoan Bryant MRN: [id number] Date of Birth: [birth year]

## 2017-06-01 ENCOUNTER — Ambulatory Visit: Payer: Medicare Other

## 2017-06-01 DIAGNOSIS — R262 Difficulty in walking, not elsewhere classified: Secondary | ICD-10-CM

## 2017-06-01 DIAGNOSIS — M25662 Stiffness of left knee, not elsewhere classified: Secondary | ICD-10-CM

## 2017-06-01 DIAGNOSIS — M25562 Pain in left knee: Secondary | ICD-10-CM

## 2017-06-01 DIAGNOSIS — R29898 Other symptoms and signs involving the musculoskeletal system: Secondary | ICD-10-CM

## 2017-06-01 NOTE — Therapy (Signed)
[facility] [address]  [place], [place], [address] Phone: [phone]   Fax:  [phone]  Physical Therapy Treatment  Patient Details  Name: Stacey Bryant MRN: [id number] Date of Birth: Oct 26, 1945 Referring Provider: Dr. Joni Fears   Encounter Date: 06/01/2017  PT End of Session - 06/01/17 1337    Visit Number  9    Number of Visits  12    Date for PT Re-Evaluation  06/15/17    PT Start Time  1321    PT Stop Time  1400    PT Time Calculation (min)  39 min    Activity Tolerance  Patient tolerated treatment well    Behavior During Therapy  [facility] for tasks assessed/performed       Past Medical History:  Diagnosis Date  . Arthritis   . GERD (gastroesophageal reflux disease)   . Headache    HX  MIGRAINES    Past Surgical History:  Procedure Laterality Date  . JOINT REPLACEMENT    . KNEE ARTHROSCOPY     BILATERAL   . 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 TOTAL KNEE ARTHROPLASTY Left 03/30/2017   Procedure: LEFT TOTAL KNEE ARTHROPLASTY;  Surgeon: Garald Balding, MD;  Location: [place];  Service: Orthopedics;  Laterality: Left;    There were no vitals filed for this visit.  Subjective Assessment - 06/01/17 1324    Subjective  Pt. reporting she wishes to transition to home program following next visit.      Patient Stated Goals  improve walking and function of L knee    Currently in Pain?  No/denies    Pain Score  0-No pain    Multiple Pain Sites  No                      [facility] Adult PT Treatment/Exercise - 06/01/17 1339      Self-Care   Self-Care  Other Self-Care Comments    Other Self-Care Comments   Discussed comprehensive HEP and performing machine strengthening at gym as pt. noting she may be interested in transitioning to home program following next visit       Knee/Hip Exercises: Aerobic   Recumbent Bike  L2 x 6 min      Knee/Hip Exercises: Machines for Strengthening   Cybex Knee Flexion  B LE's: 20# x 15 reps     Cybex Leg Press  B LE's 20# x 10 reps       Knee/Hip Exercises: Standing   Hip Flexion  Stengthening;Left;15 reps;Knee straight    Hip Flexion Limitations  red tband    Hip Abduction  Stengthening;Left;15 reps;Knee straight    Abduction Limitations  red tband    Hip Extension  Stengthening;Left;15 reps;Knee straight    Extension Limitations  red tband    Other Standing Knee Exercises  B side stepping - red tband x 25 feet each direction      Knee/Hip Exercises: Seated   Long Arc Quad  Strengthening;Left;15 reps;Weights    Long Arc Quad Weight  3 lbs.    Long Arc Quad Limitations  + ball squeeze             PT Education - 06/01/17 1409    Education provided  Yes    Education Details  3-way hip kicker with red looped TB at ankles, hs curl machine, leg press machine, side stepping with red looped TB issued to pt.  Person(s) Educated  Patient    Methods  Explanation;Demonstration;Verbal cues;Handout    Comprehension  Verbalized understanding;Returned demonstration;Verbal cues required;Need further instruction       PT Short Term Goals - 06/01/17 1521      PT SHORT TERM GOAL #1   Title  patient to be independent with initial HEP    Status  Achieved      PT SHORT TERM GOAL #2   Title  patient to improve L knee AROM 0-110    Status  Partially Met 1-114 dg         PT Long Term Goals - 05/06/17 1450      PT LONG TERM GOAL #1   Title  patient to be independent with advanced HEP    Status  On-going      PT LONG TERM GOAL #2   Title  patient to demonstrate L knee AROM 0-120    Status  On-going      PT LONG TERM GOAL #3   Title  patient to improve L LE strength to >/= 4+/5    Status  On-going      PT LONG TERM GOAL #4   Title  patient to demonstrate reciprocal gait pattern up/down 1 flight of stairs without evidence of instability    Status  On-going      PT LONG TERM GOAL #5   Title  patient to demonstrate L SLR  without quad lag demonstrating increased strength    Status  On-going            Plan - 06/01/17 1353    Clinical Impression Statement  Stacey Bryant reporting she has been feeling well and is interested in transitioning to home program following next visit reporting she may be interested in 30-day hold from therapy.  Reviewed home and gym HEP today with all activities checked for appropriateness.  HEP updated with machine strengthening and additional home exercises.  Pt. reporting she no longer has difficulty navigating stair and does not have difficulty with any other daily tasks at this point.  Will plan to check for understanding of updated HEP and perform final goal testing at upcoming visit.      PT Treatment/Interventions  ADLs/Self Care Home Management;Cryotherapy;Electrical Stimulation;Iontophoresis 78m/ml Dexamethasone;Moist Heat;Therapeutic exercise;Therapeutic activities;Functional mobility training;Stair training;Gait training;DME Instruction;Ultrasound;Balance training;Neuromuscular re-education;Patient/family education;Manual techniques;Vasopneumatic Device;Taping;Energy conservation;Dry needling;Passive range of motion    Consulted and Agree with Plan of Care  Patient       Patient will benefit from skilled therapeutic intervention in order to improve the following deficits and impairments:  Abnormal gait, Decreased activity tolerance, Decreased balance, Decreased range of motion, Decreased mobility, Difficulty walking, Pain, Decreased strength  Visit Diagnosis: Acute pain of left knee  Stiffness of left knee, not elsewhere classified  Difficulty in walking, not elsewhere classified  Other symptoms and signs involving the musculoskeletal system     Problem List Patient Active Problem List   Diagnosis Date Noted  . S/P total knee replacement using cement, left 03/30/2017  . Pain in right hip 03/17/2017  . Unilateral primary osteoarthritis, left knee 03/17/2017  . Unilateral  primary osteoarthritis, right hip 03/17/2017    MBess Bryant PTA 06/01/17 3:27 PM  [facility] [address] [place] [address]Phone: [phone]  Fax:  [phone] Name: Stacey WoodstockMRN: [id number] of Birth: [birth year]

## 2017-06-04 ENCOUNTER — Ambulatory Visit: Payer: Medicare Other

## 2017-06-04 DIAGNOSIS — M25562 Pain in left knee: Secondary | ICD-10-CM

## 2017-06-04 DIAGNOSIS — R29898 Other symptoms and signs involving the musculoskeletal system: Secondary | ICD-10-CM

## 2017-06-04 DIAGNOSIS — R262 Difficulty in walking, not elsewhere classified: Secondary | ICD-10-CM

## 2017-06-04 DIAGNOSIS — M25662 Stiffness of left knee, not elsewhere classified: Secondary | ICD-10-CM

## 2017-06-04 NOTE — Therapy (Addendum)
[facility] [address]  [place], [place], [address] Phone: [phone]   Fax:  [phone]  Physical Therapy Treatment  Patient Details  Name: Stacey Bryant MRN: [id number] Date of Birth: 1945-06-21 Referring Provider: Dr. Joni Fears  Reporting Period 05/04/17 to 06/04/17  See note below for Objective Data and Assessment of Progress/Goals.      Encounter Date: 06/04/2017  PT End of Session - 06/04/17 1034    Visit Number  10    Number of Visits  12    Date for PT Re-Evaluation  06/15/17    PT Start Time  1019    PT Stop Time  1057    PT Time Calculation (min)  38 min    Activity Tolerance  Patient tolerated treatment well    Behavior During Therapy  [facility] for tasks assessed/performed       Past Medical History:  Diagnosis Date  . Arthritis   . GERD (gastroesophageal reflux disease)   . Headache    HX  MIGRAINES    Past Surgical History:  Procedure Laterality Date  . JOINT REPLACEMENT    . KNEE ARTHROSCOPY     BILATERAL   . 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 TOTAL KNEE ARTHROPLASTY Left 03/30/2017   Procedure: LEFT TOTAL KNEE ARTHROPLASTY;  Surgeon: Garald Balding, MD;  Location: [place];  Service: Orthopedics;  Laterality: Left;    There were no vitals filed for this visit.      [facility] PT Assessment - 06/04/17 1033      Observation/Other Assessments   Focus on Therapeutic Outcomes (FOTO)   63% (37% limitation)      AROM   AROM Assessment Site  Knee    Right/Left Knee  Left    Left Knee Extension  1    Left Knee Flexion  116      PROM   PROM Assessment Site  Knee    Right/Left Knee  Left    Left Knee Extension  0    Left Knee Flexion  121      Strength   Strength Assessment Site  Hip;Knee    Right/Left Hip  Right;Left    Right Hip Flexion  4/5    Left Hip Flexion  4+/5    Right/Left Knee  Right;Left    Right Knee Flexion  4/5    Right Knee Extension  4/5    Left Knee  Flexion  4+/5    Left Knee Extension  4+/5            No data recorded       [facility] Adult PT Treatment/Exercise - 06/04/17 1219      Ambulation/Gait   Ambulation/Gait  Yes    Ambulation/Gait Assistance  7: Independent    Ambulation Distance (Feet)  100 Feet    Assistive device  None    Gait Pattern  Within Functional Limits    Stairs  Yes    Stairs Assistance  6: Modified independent (Device/Increase time)    Stair Management Technique  One rail Right;Alternating pattern    Number of Stairs  14    Height of Stairs  8    Gait Comments  Pt. now ambulating with normalized gait pattern and able to ascend descend one flight of stairs with good overall stability and reporting only R knee discomfort       Self-Care   Self-Care  Other Self-Care Comments    Other  Self-Care Comments   Final review of comprehensive HEP to check for pt. questions; pt. did require some correction with positioning for band resisted side stepping       Knee/Hip Exercises: Aerobic   Recumbent Bike  L2 x 6 min      Knee/Hip Exercises: Standing   Other Standing Knee Exercises  B side stepping, monster walk - red tband x 30 feet each direction Some cueing for positioning required       Knee/Hip Exercises: Supine   Straight Leg Raises  Strengthening;Left;15 reps no quad lag    Straight Leg Raises Limitations  2#               PT Short Term Goals - 06/04/17 1040      PT SHORT TERM GOAL #1   Title  patient to be independent with initial HEP    Status  Achieved      PT SHORT TERM GOAL #2   Title  patient to improve L knee AROM 0-110    Status  Partially Met 1-116 dg         PT Long Term Goals - 06/04/17 1039      PT LONG TERM GOAL #1   Title  patient to be independent with advanced HEP    Status  Achieved      PT LONG TERM GOAL #2   Title  patient to demonstrate L knee AROM 0-120    Status  Not Met 1-116 dg       PT LONG TERM GOAL #3   Title  patient to improve L LE strength to  >/= 4+/5    Status  Achieved      PT LONG TERM GOAL #4   Title  patient to demonstrate reciprocal gait pattern up/down 1 flight of stairs without evidence of instability    Status  Achieved      PT LONG TERM GOAL #5   Title  patient to demonstrate L SLR without quad lag demonstrating increased strength    Status  Achieved            Plan - 06/04/17 1224    Clinical Impression Statement  Pt. has made good progress with functional strength and ROM with therapy thus far.  Pt. seen today requesting to be put on hold from therapy as she feels she can continue to progress at home with HEP/gym program.  Supervising PT consulted upon previous visit and approving this plan to go on 30-day hold.  Performed final goal testing and HEP review today with pt. able to achieve or partially achieve all LTG's with exception of L knee AROM goal.  Pt. L knee AROM 1-116 dg today however PROM 0-121 dg demonstrating some remaining HS weakness.  Pt. verbalized full understanding of HEP/gym program and now on 30-day hold from therapy.    PT Treatment/Interventions  ADLs/Self Care Home Management;Cryotherapy;Electrical Stimulation;Iontophoresis 29m/ml Dexamethasone;Moist Heat;Therapeutic exercise;Therapeutic activities;Functional mobility training;Stair training;Gait training;DME Instruction;Ultrasound;Balance training;Neuromuscular re-education;Patient/family education;Manual techniques;Vasopneumatic Device;Taping;Energy conservation;Dry needling;Passive range of motion    PT Next Visit Plan  30-day hold    Consulted and Agree with Plan of Care  Patient       Patient will benefit from skilled therapeutic intervention in order to improve the following deficits and impairments:  Abnormal gait, Decreased activity tolerance, Decreased balance, Decreased range of motion, Decreased mobility, Difficulty walking, Pain, Decreased strength  Visit Diagnosis: Stiffness of left knee, not elsewhere classified  Acute pain of  left knee  Difficulty in walking, not elsewhere classified  Other symptoms and signs involving the musculoskeletal system     Problem List Patient Active Problem List   Diagnosis Date Noted  . S/P total knee replacement using cement, left 03/30/2017  . Pain in right hip 03/17/2017  . Unilateral primary osteoarthritis, left knee 03/17/2017  . Unilateral primary osteoarthritis, right hip 03/17/2017    Stacey Bryant, PTA 06/04/17 12:29 PM   PHYSICAL THERAPY DISCHARGE SUMMARY  Visits from Start of Care: 10  Current functional level related to goals / functional outcomes: See above   Remaining deficits: See above - all goals other than end range flexion not met   Education / Equipment: HEP  Plan: Patient agrees to discharge.  Patient goals were partially met. Patient is being discharged due to being pleased with the current functional level.  ?????     Stacey Bryant, PT, DPT 07/07/17 8:28 AM  [facility] [address]  [place], [place], [address] Phone: [phone]   Fax:  [phone]  Name: Stacey Bryant MRN: [id number] Date of Birth: 1945/12/04

## 2017-07-26 ENCOUNTER — Encounter (INDEPENDENT_AMBULATORY_CARE_PROVIDER_SITE_OTHER): Payer: Self-pay | Admitting: Orthopaedic Surgery

## 2017-07-26 ENCOUNTER — Ambulatory Visit (INDEPENDENT_AMBULATORY_CARE_PROVIDER_SITE_OTHER): Payer: Medicare Other | Admitting: Orthopaedic Surgery

## 2017-07-26 VITALS — BP 119/65 | HR 82 | Resp 18 | Ht 65.75 in | Wt 165.0 lb

## 2017-07-26 DIAGNOSIS — Z96652 Presence of left artificial knee joint: Secondary | ICD-10-CM | POA: Diagnosis not present

## 2017-07-26 NOTE — Progress Notes (Signed)
Office Visit Note   Patient: Stacey Bryant           Date of Birth: 01-15-1946           MRN: [id number] Visit Date: 07/26/2017              Requested by: Raynelle Jan., MD [address] [place], [place] [address] PCP: Raynelle Jan., MD   Assessment & Plan: Visit Diagnoses:  1. History of left knee replacement     Plan: Follow-up 6 months.  Encourage continued exercises  Follow-Up Instructions: Return in about 6 months (around 01/26/2018).   Orders:  No orders of the defined types were placed in this encounter.  No orders of the defined types were placed in this encounter.     Procedures: No procedures performed   Clinical Data: No additional findings.   Subjective: Chief Complaint  Patient presents with  . Post-op Follow-up    03/20/17 L TKA 4 mo f/u NUMBNESS ON LATERAL SIDE  4 months status post primary left total knee replacement.  Doing well.  No complaints  HPI  Review of Systems  Constitutional: Negative for fatigue and fever.  HENT: Negative for ear pain.   Eyes: Negative for pain.  Respiratory: Negative for cough and shortness of breath.   Cardiovascular: Positive for leg swelling.  Gastrointestinal: Negative for constipation and diarrhea.  Genitourinary: Negative for difficulty urinating.  Musculoskeletal: Negative for back pain and neck pain.  Skin: Negative for rash.  Allergic/Immunologic: Negative for food allergies.  Neurological: Positive for numbness. Negative for weakness.  Hematological: Does not bruise/bleed easily.  Psychiatric/Behavioral: Negative for sleep disturbance.     Objective: Vital Signs: BP 119/65 (BP Location: Left Arm, Patient Position: Sitting, Cuff Size: Normal)   Pulse 82   Resp 18   Ht 5' 5.75" (1.67 m)   Wt 165 lb (74.8 kg)   BMI 26.83 kg/m   Physical Exam  Constitutional: She is oriented to person, place, and time. She appears well-developed and well-nourished.  HENT:  Mouth/Throat: Oropharynx is clear  and moist.  Eyes: Pupils are equal, round, and reactive to light. EOM are normal.  Pulmonary/Chest: Effort normal.  Neurological: She is alert and oriented to person, place, and time.  Skin: Skin is warm and dry.  Psychiatric: She has a normal mood and affect. Her behavior is normal.    Ortho Exam awake alert and oriented x3.  Comfortable sitting.  Walks without a limp.  No opening with varus valgus stress left knee.  Knee was not hot warm or red.  Slight increased girth of the left knee compared to the right.  No popliteal pain.  No calf pain.  Neurovascular exam intact.  Very mild swelling of both ankles.  Does have some varicosities.  Full extension left knee.  Flexed 20 degrees.  No anterior drawer sign.  Specialty Comments:  No specialty comments available.  Imaging: No results found.   PMFS History: Patient Active Problem List   Diagnosis Date Noted  . S/P total knee replacement using cement, left 03/30/2017  . Pain in right hip 03/17/2017  . Unilateral primary osteoarthritis, left knee 03/17/2017  . Unilateral primary osteoarthritis, right hip 03/17/2017   Past Medical History:  Diagnosis Date  . Arthritis   . GERD (gastroesophageal reflux disease)   . Headache    HX  MIGRAINES    History reviewed. No pertinent family history.  Past Surgical History:  Procedure Laterality Date  . JOINT REPLACEMENT    .  KNEE ARTHROSCOPY     BILATERAL   . 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 TOTAL KNEE ARTHROPLASTY Left 03/30/2017   Procedure: LEFT TOTAL KNEE ARTHROPLASTY;  Surgeon: Valeria Batman, MD;  Location: [facility] OR;  Service: Orthopedics;  Laterality: Left;   Social History   Occupational History  . Not on file  Tobacco Use  . Smoking status: Never Smoker  . Smokeless tobacco: Current User    Types: Chew  Substance and Sexual Activity  . Alcohol use: Yes    Alcohol/week: 0.6 oz    Types: 1 Glasses of wine per week    Comment: OCC  . Drug use: No  . Sexual  activity: Not on file

## 2017-12-06 ENCOUNTER — Telehealth (INDEPENDENT_AMBULATORY_CARE_PROVIDER_SITE_OTHER): Payer: Self-pay | Admitting: Orthopaedic Surgery

## 2017-12-06 NOTE — Telephone Encounter (Signed)
Patient having Cataract surgery on 10/17. Patient wants to know if she will need to take an antibiotic before her surgery due to TKR 03/30/17. Please call to advise.

## 2017-12-06 NOTE — Telephone Encounter (Signed)
PLEASE ADVISE.

## 2017-12-06 NOTE — Telephone Encounter (Signed)
Opened in error

## 2017-12-06 NOTE — Telephone Encounter (Signed)
Ask her eye doctor if there is a risk of infection such that she would need antibiotics

## 2017-12-07 NOTE — Telephone Encounter (Signed)
Notified pt to check with eye dr.

## 2018-01-28 ENCOUNTER — Ambulatory Visit (INDEPENDENT_AMBULATORY_CARE_PROVIDER_SITE_OTHER): Payer: Medicare Other | Admitting: Orthopaedic Surgery

## 2018-02-04 ENCOUNTER — Encounter (INDEPENDENT_AMBULATORY_CARE_PROVIDER_SITE_OTHER): Payer: Self-pay | Admitting: Orthopaedic Surgery

## 2018-02-04 ENCOUNTER — Ambulatory Visit (INDEPENDENT_AMBULATORY_CARE_PROVIDER_SITE_OTHER): Payer: Medicare Other | Admitting: Orthopaedic Surgery

## 2018-02-04 VITALS — Ht 66.0 in | Wt 170.0 lb

## 2018-02-04 DIAGNOSIS — Z96652 Presence of left artificial knee joint: Secondary | ICD-10-CM

## 2018-02-04 DIAGNOSIS — M7061 Trochanteric bursitis, right hip: Secondary | ICD-10-CM

## 2018-02-04 MED ORDER — BUPIVACAINE HCL 0.5 % IJ SOLN
2.0000 mL | INTRAMUSCULAR | Status: AC | PRN
  Administered 2018-02-04: 2 mL via INTRA_ARTICULAR

## 2018-02-04 MED ORDER — LIDOCAINE HCL 1 % IJ SOLN
2.0000 mL | INTRAMUSCULAR | Status: AC | PRN
  Administered 2018-02-04: 2 mL

## 2018-02-04 MED ORDER — METHYLPREDNISOLONE ACETATE 40 MG/ML IJ SUSP
80.0000 mg | INTRAMUSCULAR | Status: AC | PRN
  Administered 2018-02-04: 80 mg

## 2018-02-04 NOTE — Progress Notes (Deleted)
   Office Visit Note   Patient: Stacey Bryant           Date of Birth: 1946-02-06           MRN: [id number] Visit Date: 02/04/2018              Requested by: Raynelle JanSpry, Heather M., MD [address] [place], [place] [id number] PCP: Raynelle JanSpry, Heather M., MD   Assessment & Plan: Visit Diagnoses: No diagnosis found.  Plan: ***  Follow-Up Instructions: No follow-ups on file.   Orders:  No orders of the defined types were placed in this encounter.  No orders of the defined types were placed in this encounter.     Procedures: No procedures performed   Clinical Data: No additional findings.   Subjective: Chief Complaint  Patient presents with  . Left Knee - Follow-up    03/30/17 Left Total Knee Arthroplasty  . Right Hip - Pain  Patient returns for six month follow up. She is status post left total knee arthroplasty on 03/30/17. She denies any problems with the knee. She is experiencing some pain in her right hip.   HPI  Review of Systems   Objective: Vital Signs: Ht 5\' 6"  (1.676 m)   Wt 170 lb (77.1 kg)   BMI 27.44 kg/m   Physical Exam  Ortho Exam  Specialty Comments:  No specialty comments available.  Imaging: No results found.   PMFS History: Patient Active Problem List   Diagnosis Date Noted  . S/P total knee replacement using cement, left 03/30/2017  . Pain in right hip 03/17/2017  . Unilateral primary osteoarthritis, left knee 03/17/2017  . Unilateral primary osteoarthritis, right hip 03/17/2017   Past Medical History:  Diagnosis Date  . Arthritis   . GERD (gastroesophageal reflux disease)   . Headache    HX  MIGRAINES    No family history on file.  Past Surgical History:  Procedure Laterality Date  . JOINT REPLACEMENT    . KNEE ARTHROSCOPY     BILATERAL   . 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 TOTAL KNEE ARTHROPLASTY Left 03/30/2017   Procedure: LEFT TOTAL KNEE ARTHROPLASTY;  Surgeon: Valeria BatmanWhitfield, Peter W, MD;  [facility];  Service:  Orthopedics;  Laterality: Left;   Social History   Occupational History  . Not on file  Tobacco Use  . Smoking status: Never Smoker  . Smokeless tobacco: Current User    Types: Chew  Substance and Sexual Activity  . Alcohol use: Yes    Alcohol/week: 1.0 standard drinks    Types: 1 Glasses of wine per week    Comment: OCC  . Drug use: No  . Sexual activity: Not on file

## 2018-02-04 NOTE — Progress Notes (Signed)
Office Visit Note   Patient: Stacey Bryant           Date of Birth: 1945-04-23           MRN: [id number] Visit Date: 02/04/2018              Requested by: Raynelle Jan., MD [address] [place], [place] [address] PCP: Raynelle Jan., MD   Assessment & Plan: Visit Diagnoses:  1. S/P total knee replacement using cement, left   2. Trochanteric bursitis, right hip     Plan: 10 months status post primary left total knee replacement and very happy with the results.  Doing quite well.  Urged her to continue with her exercises.  Also has area of tenderness over the greater trochanter of her right hip.  From a diagnostic and therapeutic standpoint will inject with cortisone and monitor response  Follow-Up Instructions: Return if symptoms worsen or fail to improve.   Orders:  No orders of the defined types were placed in this encounter.  No orders of the defined types were placed in this encounter.     Procedures: Large Joint Inj: R greater trochanter on 02/04/2018 10:35 AM Indications: pain and diagnostic evaluation Details: 25 G 3.5 in needle, superolateral approach  Arthrogram: No  Medications: 2 mL lidocaine 1 %; 2 mL bupivacaine 0.5 %; 80 mg methylPREDNISolone acetate 40 MG/ML Procedure, treatment alternatives, risks and benefits explained, specific risks discussed. Consent was given by the patient. Immediately prior to procedure a time out was called to verify the correct patient, procedure, equipment, support staff and site/side marked as required. Patient was prepped and draped in the usual sterile fashion.       Clinical Data: No additional findings.   Subjective: Chief Complaint  Patient presents with  . Left Knee - Follow-up    03/30/17 Left Total Knee Arthroplasty  . Right Hip - Pain  Right hip pain insidious in onset and located over the greater trochanter.  Not having any trouble with her left total knee replacement and very happy with the results.  No  limitations or compromise  HPI  Review of Systems   Objective: Vital Signs: Ht 5\' 6"  (1.676 m)   Wt 170 lb (77.1 kg)   BMI 27.44 kg/m   Physical Exam  Constitutional: She is oriented to person, place, and time. She appears well-developed and well-nourished.  HENT:  Mouth/Throat: Oropharynx is clear and moist.  Eyes: Pupils are equal, round, and reactive to light. EOM are normal.  Pulmonary/Chest: Effort normal.  Neurological: She is alert and oriented to person, place, and time.  Skin: Skin is warm and dry.  Psychiatric: She has a normal mood and affect. Her behavior is normal.    Ortho Exam awake alert and oriented x3.  Comfortable sitting.  Full extension left knee and 120 degrees of flexion.  No instability.  Some hypertrophic changes about the knee.  The knee is minimally warm compared to the right knee no effusion.  Incision healed nicely.  Still slight decreased sensibility on the lateral aspect of her knee.  No calf pain.  No popliteal discomfort Has direct tenderness over the greater trochanter of her right hip no pain with range of motion.  Straight leg raise negative  Specialty Comments:  No specialty comments available.  Imaging: No results found.   PMFS History: Patient Active Problem List   Diagnosis Date Noted  . Trochanteric bursitis, right hip 02/04/2018  . S/P total knee replacement  using cement, left 03/30/2017  . Pain in right hip 03/17/2017  . Unilateral primary osteoarthritis, left knee 03/17/2017  . Unilateral primary osteoarthritis, right hip 03/17/2017   Past Medical History:  Diagnosis Date  . Arthritis   . GERD (gastroesophageal reflux disease)   . Headache    HX  MIGRAINES  . Macular degeneration     History reviewed. No pertinent family history.  Past Surgical History:  Procedure Laterality Date  . CATARACT EXTRACTION, BILATERAL    . JOINT REPLACEMENT    . KNEE ARTHROSCOPY     BILATERAL   . TOTAL KNEE ARTHROPLASTY Left 03/30/2017    . TOTAL KNEE ARTHROPLASTY Left 03/30/2017   Procedure: LEFT TOTAL KNEE ARTHROPLASTY;  Surgeon: Valeria BatmanWhitfield, Shellene Sweigert W, MD;  [facility];  Service: Orthopedics;  Laterality: Left;   Social History   Occupational History  . Not on file  Tobacco Use  . Smoking status: Never Smoker  . Smokeless tobacco: Current User    Types: Chew  Substance and Sexual Activity  . Alcohol use: Yes    Alcohol/week: 1.0 standard drinks    Types: 1 Glasses of wine per week    Comment: OCC  . Drug use: No  . Sexual activity: Not on file     Valeria BatmanPeter W Zienna Ahlin, MD   Note - This record has been created using [employer] software.  Chart creation errors have been sought, but may not always  have been located. Such creation errors do not reflect on  the standard of medical care.

## 2018-10-18 ENCOUNTER — Encounter: Payer: Self-pay | Admitting: Orthopaedic Surgery

## 2018-10-18 ENCOUNTER — Ambulatory Visit: Payer: Self-pay

## 2018-10-18 ENCOUNTER — Ambulatory Visit (INDEPENDENT_AMBULATORY_CARE_PROVIDER_SITE_OTHER): Payer: Medicare Other | Admitting: Orthopaedic Surgery

## 2018-10-18 ENCOUNTER — Other Ambulatory Visit: Payer: Self-pay

## 2018-10-18 VITALS — BP 144/70 | HR 79 | Ht 66.0 in | Wt 171.0 lb

## 2018-10-18 DIAGNOSIS — M25562 Pain in left knee: Secondary | ICD-10-CM

## 2018-10-18 DIAGNOSIS — M25561 Pain in right knee: Secondary | ICD-10-CM | POA: Diagnosis not present

## 2018-10-18 NOTE — Progress Notes (Signed)
Office Visit Note   Patient: Stacey Bryant           Date of Birth: August 17, 1945           MRN: [id number] Visit Date: 10/18/2018              Requested by: Raynelle JanSpry, Heather M., MD [address] [place],  [place] [id number] PCP: Raynelle JanSpry, Heather M., MD   Assessment & Plan: Visit Diagnoses:  1. Acute pain of left knee   2. Acute pain of right knee     Plan: Stacey Bryant fell directly on the anterior aspect of both knees yesterday and was concerned that she may have sustained a fracture.  She is over a year status post left total knee replacement.  She is having some soreness more in the right than the left knee and is not using any ambulatory aid.  Not having too much pain.  Films are negative for any obvious fracture.  Total knee replacement is in excellent position.  Advanced degenerative changes in the right knee.  Will use ice, Voltaren gel, Tylenol and return to the office in 3 to 4 weeks or sooner if no improvement  Follow-Up Instructions: Return if symptoms worsen or fail to improve.   Orders:  Orders Placed This Encounter  Procedures  . XR KNEE 3 VIEW LEFT  . XR KNEE 3 VIEW RIGHT   No orders of the defined types were placed in this encounter.     Procedures: No procedures performed   Clinical Data: No additional findings.   Subjective: Chief Complaint  Patient presents with  . Left Knee - Pain  . Right Knee - Pain  Patient presents today for bilateral knee pain. She missed a step and fell directly on her knees yesterday, 10/17/2018. She is having pain on the anterior side of both her knees. She is taking tylenol as needed. She has a history of a left total knee arthroplasty on 03/30/2017.  HPI  Review of Systems   Objective: Vital Signs: BP (!) 144/70   Pulse 79   Ht 5\' 6"  (1.676 m)   Wt 171 lb (77.6 kg)   BMI 27.60 kg/m   Physical Exam Constitutional:      Appearance: She is well-developed.  Eyes:     Pupils: Pupils are equal, round, and reactive to light.   Pulmonary:     Effort: Pulmonary effort is normal.  Skin:    General: Skin is warm and dry.  Neurological:     Mental Status: She is alert and oriented to person, place, and time.  Psychiatric:        Behavior: Behavior normal.     Ortho Exam awake alert and oriented x3.  Comfortable sitting.  Walks without ambulatory aid.  Has some bruising along the medial anterior aspect of the left knee consistent with a very small hematoma.  No knee effusion.  No instability.  Full extension over 110 degrees of flexion.  Right knee with increased valgus little bit more swelling but without obvious effusion.  No instability.  Lacked a few degrees to full extension and flexed about 100 degrees.  Neurologically intact.  No distal edema or calf pain.  No hip pain bilaterally and straight leg raise negative  Specialty Comments:  No specialty comments available.  Imaging: Xr Knee 3 View Left  Result Date: 10/18/2018 Films of the left knee were obtained in several projections.  A well-positioned total knee replacement is identified.  No acute  changes as patient had a fall yesterday.  Good glue mantle and without evidence of loosening.  Patella tracks in the midline  Xr Knee 3 View Right  Result Date: 10/18/2018 Films of the right knee were obtained in 3 projections standing.  There are advanced arthritic changes in all 3 compartments with increased valgus.  There is narrowing of the medial lateral and patellofemoral joint spaces with peripheral osteophytes and subchondral sclerosis.  No acute changes identified    PMFS History: Patient Active Problem List   Diagnosis Date Noted  . Trochanteric bursitis, right hip 02/04/2018  . S/P total knee replacement using cement, left 03/30/2017  . Pain in right hip 03/17/2017  . Unilateral primary osteoarthritis, left knee 03/17/2017  . Unilateral primary osteoarthritis, right hip 03/17/2017   Past Medical History:  Diagnosis Date  . Arthritis   . GERD  (gastroesophageal reflux disease)   . Headache    HX  MIGRAINES  . Macular degeneration     History reviewed. No pertinent family history.  Past Surgical History:  Procedure Laterality Date  . CATARACT EXTRACTION, BILATERAL    . JOINT REPLACEMENT    . KNEE ARTHROSCOPY     BILATERAL   . 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 TOTAL KNEE ARTHROPLASTY Left 03/30/2017   Procedure: LEFT TOTAL KNEE ARTHROPLASTY;  Surgeon: Garald Balding, MD;  Location: [facility];  Service: Orthopedics;  Laterality: Left;   Social History   Occupational History  . Not on file  Tobacco Use  . Smoking status: Never Smoker  . Smokeless tobacco: Current User    Types: Chew  Substance and Sexual Activity  . Alcohol use: Yes    Alcohol/week: 1.0 standard drinks    Types: 1 Glasses of wine per week    Comment: OCC  . Drug use: No  . Sexual activity: Not on file

## 2019-04-17 ENCOUNTER — Telehealth: Payer: Self-pay | Admitting: Orthopaedic Surgery

## 2019-04-17 ENCOUNTER — Other Ambulatory Visit: Payer: Self-pay | Admitting: Orthopaedic Surgery

## 2019-04-17 MED ORDER — CEPHALEXIN 500 MG PO CAPS: ORAL_CAPSULE | ORAL | 0 refills | Status: DC

## 2019-04-17 NOTE — Telephone Encounter (Signed)
Spoke with patient. She wanted to make sure it would be safe to take the Keflex with the Covid vaccine. I told her to contact her pharmacist or PCP to be sure; but I haven't heard anything where it would be contraindicated.

## 2019-04-17 NOTE — Telephone Encounter (Signed)
Tried to call patient. No answer. Left message that antibiotics have been sent to her pharmacy for dental procedure tomorrow.

## 2019-04-17 NOTE — Telephone Encounter (Signed)
Patient called.   She was recently prescribed a new medication and forgot to mention she will be receiving the COVID vaccine.    Call back number: [phone]

## 2019-04-17 NOTE — Telephone Encounter (Signed)
Please advise 

## 2019-04-17 NOTE — Telephone Encounter (Signed)
Pt called in said she has a dental procedure tomorrow and she is needing to know if she is needing any pre-dental antibiotics since she did have a knee replacement 03-30-2017. If so please have that sent to [facility] [place] [address] in [place].   [phone]

## 2019-04-17 NOTE — Telephone Encounter (Signed)
Per protocol and assuming she has no allergies to penicillin I believe it is ampicillin-please check and call  in

## 2019-05-13 ENCOUNTER — Ambulatory Visit: Payer: BC Managed Care – PPO

## 2019-06-15 ENCOUNTER — Encounter: Payer: Self-pay | Admitting: Orthopaedic Surgery

## 2019-06-15 ENCOUNTER — Other Ambulatory Visit: Payer: Self-pay

## 2019-06-15 ENCOUNTER — Ambulatory Visit (INDEPENDENT_AMBULATORY_CARE_PROVIDER_SITE_OTHER): Payer: Medicare Other | Admitting: Orthopaedic Surgery

## 2019-06-15 VITALS — Ht 66.0 in | Wt 175.0 lb

## 2019-06-15 DIAGNOSIS — M25551 Pain in right hip: Secondary | ICD-10-CM | POA: Diagnosis not present

## 2019-06-15 MED ORDER — METHYLPREDNISOLONE ACETATE 40 MG/ML IJ SUSP
40.0000 mg | INTRAMUSCULAR | Status: AC | PRN
  Administered 2019-06-15: 40 mg via INTRAMUSCULAR

## 2019-06-15 MED ORDER — LIDOCAINE HCL 1 % IJ SOLN
2.0000 mL | INTRAMUSCULAR | Status: AC | PRN
  Administered 2019-06-15: 2 mL

## 2019-06-15 NOTE — Progress Notes (Signed)
Office Visit Note   Patient: Stacey Bryant           Date of Birth: 11/24/1945           MRN: [id number] Visit Date: 06/15/2019              Requested by: Verdell Carmine., MD [address] [place],  [place] [address] PCP: Verdell Carmine., MD   Assessment & Plan: Visit Diagnoses:  1. Pain in right hip     Plan: Mrs. Joaquin has been seen in the past with clinical evidence of trochanteric bursitis.  Cortisone injection has made a big difference.  She notes now that she is having more local tenderness in the right parasacral region proximal to the greater trochanter.  I will inject this with cortisone and monitor response.  I suspect this may actually be referred from her lumbar spine.  If no improvement with the above injection I had like to obtain films of her back and then reevaluate her hip arthritis.  Also discussed use of Tylenol and Advil as needed  Follow-Up Instructions: Return if symptoms worsen or fail to improve.   Orders:  Orders Placed This Encounter  Procedures  . Trigger Point Inj   No orders of the defined types were placed in this encounter.     Procedures: Trigger Point Inj  Date/Time: 06/15/2019 3:39 PM Performed by: Garald Balding, MD Authorized by: Garald Balding, MD   Indications:  Pain Total # of Trigger Points:  1 Location: back   Needle Size:  22 G Approach:  Dorsal Medications #1:  2 mL lidocaine 1 %; 40 mg methylPREDNISolone acetate 40 MG/ML Comments: Injected parasacral area on the right in area of localized tenderness     Clinical Data: No additional findings.   Subjective: Chief Complaint  Patient presents with  . Right Hip - Pain  Patient presents today for recurrent right hip pain. She said that it has been hurting for several months, but has worsened recently. She stepped in a hole last week while walking her dog and states that her hip pain has worsened since then. She did not fall. Her pain is located in her right buttock.  The pain does not radiate down her leg or into her groin. She seems to only hurt with walking, and has relief with standing or sitting. No numbness, tingling, or weakness in her lower extremities. Tylenol and Advil is not helping. She will occasionally have pain that radiates into her lower back. She does not want x-rays today.  Prior films of her right hip demonstrated significant arthritis  HPI  Review of Systems  Constitutional: Negative for fatigue.  HENT: Negative for ear pain.   Eyes: Negative for pain.  Respiratory: Negative for shortness of breath.   Cardiovascular: Negative for leg swelling.  Gastrointestinal: Negative for constipation and diarrhea.  Endocrine: Negative for cold intolerance and heat intolerance.  Genitourinary: Negative for difficulty urinating.  Musculoskeletal: Positive for joint swelling.  Skin: Negative for rash.  Allergic/Immunologic: Negative for food allergies.  Neurological: Negative for weakness.  Hematological: Does not bruise/bleed easily.  Psychiatric/Behavioral: Negative for sleep disturbance.     Objective: Vital Signs: Ht 5\' 6"  (1.676 m)   Wt 175 lb (79.4 kg)   BMI 28.25 kg/m   Physical Exam Constitutional:      Appearance: She is well-developed.  Eyes:     Pupils: Pupils are equal, round, and reactive to light.  Pulmonary:  Effort: Pulmonary effort is normal.  Skin:    General: Skin is warm and dry.  Neurological:     Mental Status: She is alert and oriented to person, place, and time.  Psychiatric:        Behavior: Behavior normal.     Ortho Exam localized area of tenderness in the right parasacral region.  No masses.  No pain over the greater trochanter.  Does have some limited range of motion of her right hip consistent with her arthritis.  No percussible tenderness of the lumbar spine.  Straight leg raise negative  Specialty Comments:  No specialty comments available.  Imaging: No results found.   PMFS  History: Patient Active Problem List   Diagnosis Date Noted  . Trochanteric bursitis, right hip 02/04/2018  . S/P total knee replacement using cement, left 03/30/2017  . Pain in right hip 03/17/2017  . Unilateral primary osteoarthritis, left knee 03/17/2017  . Unilateral primary osteoarthritis, right hip 03/17/2017   Past Medical History:  Diagnosis Date  . Arthritis   . GERD (gastroesophageal reflux disease)   . Headache    HX  MIGRAINES  . Macular degeneration     History reviewed. No pertinent family history.  Past Surgical History:  Procedure Laterality Date  . CATARACT EXTRACTION, BILATERAL    . JOINT REPLACEMENT    . KNEE ARTHROSCOPY     BILATERAL   . 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 TOTAL KNEE ARTHROPLASTY Left 03/30/2017   Procedure: LEFT TOTAL KNEE ARTHROPLASTY;  Surgeon: Valeria Batman, MD;  Location: [facility] OR;  Service: Orthopedics;  Laterality: Left;   Social History   Occupational History  . Not on file  Tobacco Use  . Smoking status: Never Smoker  . Smokeless tobacco: Current User    Types: Chew  Substance and Sexual Activity  . Alcohol use: Yes    Alcohol/week: 1.0 standard drinks    Types: 1 Glasses of wine per week    Comment: OCC  . Drug use: No  . Sexual activity: Not on file

## 2019-06-21 ENCOUNTER — Telehealth: Payer: Self-pay

## 2019-06-21 NOTE — Telephone Encounter (Signed)
Please advise. She was the patient that stepped in a hole and refused back x-rays at her last visit.

## 2019-06-21 NOTE — Telephone Encounter (Signed)
Needs return to office for films and reevaluation. Try tylenol for pain. OK to TRx tramadol if needed

## 2019-06-21 NOTE — Telephone Encounter (Signed)
Spoke with patient. Worked in tomorrow afternoon. She is aware that she may have to way a bit to be seen.

## 2019-06-21 NOTE — Telephone Encounter (Signed)
Patient Left VM on Triage phone. States she had inj (hip) and is having pain. Pain is going down leg. Would like to know what she needs to do.  CB [phone] or [phone].

## 2019-06-22 ENCOUNTER — Ambulatory Visit (INDEPENDENT_AMBULATORY_CARE_PROVIDER_SITE_OTHER): Payer: Medicare Other | Admitting: Orthopaedic Surgery

## 2019-06-22 ENCOUNTER — Encounter: Payer: Self-pay | Admitting: Orthopaedic Surgery

## 2019-06-22 ENCOUNTER — Ambulatory Visit: Payer: Self-pay

## 2019-06-22 ENCOUNTER — Other Ambulatory Visit: Payer: Self-pay

## 2019-06-22 VITALS — Ht 66.0 in | Wt 175.0 lb

## 2019-06-22 DIAGNOSIS — M545 Low back pain, unspecified: Secondary | ICD-10-CM | POA: Insufficient documentation

## 2019-06-22 DIAGNOSIS — M5441 Lumbago with sciatica, right side: Secondary | ICD-10-CM | POA: Diagnosis not present

## 2019-06-22 DIAGNOSIS — M1611 Unilateral primary osteoarthritis, right hip: Secondary | ICD-10-CM

## 2019-06-22 DIAGNOSIS — G8929 Other chronic pain: Secondary | ICD-10-CM

## 2019-06-22 DIAGNOSIS — M25551 Pain in right hip: Secondary | ICD-10-CM | POA: Diagnosis not present

## 2019-06-22 MED ORDER — TRAMADOL HCL 50 MG PO TABS: ORAL_TABLET | ORAL | 0 refills | Status: DC

## 2019-06-22 NOTE — Progress Notes (Signed)
Office Visit Note   Patient: Stacey Bryant           Date of Birth: 10/09/1945           MRN: [id number] Visit Date: 06/22/2019              Requested by: Verdell Carmine., MD [address] [place],  [place] [address] PCP: Verdell Carmine., MD   Assessment & Plan: Visit Diagnoses:  1. Pain in right hip   2. Unilateral primary osteoarthritis, right hip   3. Chronic right-sided low back pain with right-sided sciatica     Plan: Stacey Bryant was recently evaluated for pain in the area of the right buttock.  I injected the area of greatest tenderness and she notes it only helped for approximately 24 hours.  I had her come back to the office today to obtain films of her hips and her back.  She does have significant arthritis of the right hip which I suspect is creating majority of her pain.  She does have significant degenerative disc disease at L5-S1 and I would not be surprised if she is having some referred pain from her lumbar spine as well.  I am going to start with an intra-articular cortisone injection of the right hip.  We will schedule this for her and have her return.  We may want to consider an MRI scan of her lumbar spine at some point.  Having difficulty sleeping and being on her feet.  Will prescribe tramadol as she has not done well with just Tylenol and Advil  Follow-Up Instructions: Return After intra-articular injection right hip.   Orders:  Orders Placed This Encounter  Procedures  . XR Lumbar Spine 2-3 Views  . Ambulatory referral to Physical Medicine Rehab   Meds ordered this encounter  Medications  . traMADol (ULTRAM) 50 MG tablet    Sig: Take 1-2 tablets PO BID prn for pain    Dispense:  30 tablet    Refill:  0      Procedures: No procedures performed   Clinical Data: No additional findings.   Subjective: Chief Complaint  Patient presents with  . Right Hip - Pain  Patient presents today for a one week follow up. She states that she is having right hip  pain that radiates down her leg. She stated at her last visit a week ago that she had recently stepped into a hole, causing her hip pain to worsen in the buttock area. No numbness, tingling, or weakness. She said that the pain is worse with walking. She is taking a lot of advil or tylenol. She has no pain with sitting or lying down.   HPI  Review of Systems   Objective: Vital Signs: Ht 5\' 6"  (1.676 m)   Wt 175 lb (79.4 kg)   BMI 28.25 kg/m   Physical Exam Constitutional:      Appearance: She is well-developed.  Eyes:     Pupils: Pupils are equal, round, and reactive to light.  Pulmonary:     Effort: Pulmonary effort is normal.  Skin:    General: Skin is warm and dry.  Neurological:     Mental Status: She is alert and oriented to person, place, and time.  Psychiatric:        Behavior: Behavior normal.     Ortho Exam awake alert and oriented x3.  Comfortable sitting.  Certainly has limited range of motion of the right hip with pain on the  extremes of motion i.e. about 15 degrees of both internal and external rotation.  No pain range of motion of the left hip.  Straight leg raise negative.  No percussible tenderness of the lumbar spine.  Motor exam appears to be intact  Specialty Comments:  No specialty comments available.  Imaging: XR Lumbar Spine 2-3 Views  Result Date: 06/22/2019 AP lateral lumbar spine were obtained demonstrating a slight degenerative curvature to the left.  There is significant loss of the disc space between L5 and S1 with about a grade 2 anterior listhesis.  Facet joint changes at L4-5 and L5-S1 consistent with degenerative arthritis and in addition both hips were visualized demonstrating advanced arthritis of the right hip with peripheral osteophytes and narrowing of the joint space associated with subchondral sclerosis    PMFS History: Patient Active Problem List   Diagnosis Date Noted  . Low back pain 06/22/2019  . Trochanteric bursitis, right hip  02/04/2018  . S/P total knee replacement using cement, left 03/30/2017  . Pain in right hip 03/17/2017  . Unilateral primary osteoarthritis, left knee 03/17/2017  . Unilateral primary osteoarthritis, right hip 03/17/2017   Past Medical History:  Diagnosis Date  . Arthritis   . GERD (gastroesophageal reflux disease)   . Headache    HX  MIGRAINES  . Macular degeneration     History reviewed. No pertinent family history.  Past Surgical History:  Procedure Laterality Date  . CATARACT EXTRACTION, BILATERAL    . JOINT REPLACEMENT    . KNEE ARTHROSCOPY     BILATERAL   . 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 TOTAL KNEE ARTHROPLASTY Left 03/30/2017   Procedure: LEFT TOTAL KNEE ARTHROPLASTY;  Surgeon: Valeria Batman, MD;  Location: [facility] OR;  Service: Orthopedics;  Laterality: Left;   Social History   Occupational History  . Not on file  Tobacco Use  . Smoking status: Never Smoker  . Smokeless tobacco: Current User    Types: Chew  Substance and Sexual Activity  . Alcohol use: Yes    Alcohol/week: 1.0 standard drinks    Types: 1 Glasses of wine per week    Comment: OCC  . Drug use: No  . Sexual activity: Not on file

## 2019-06-27 ENCOUNTER — Ambulatory Visit: Payer: Medicare Other | Admitting: Orthopaedic Surgery

## 2019-07-03 ENCOUNTER — Encounter: Payer: Self-pay | Admitting: Orthopaedic Surgery

## 2019-07-07 ENCOUNTER — Ambulatory Visit (INDEPENDENT_AMBULATORY_CARE_PROVIDER_SITE_OTHER): Payer: Medicare Other | Admitting: Physical Medicine and Rehabilitation

## 2019-07-07 ENCOUNTER — Other Ambulatory Visit: Payer: Self-pay

## 2019-07-07 ENCOUNTER — Telehealth: Payer: Self-pay | Admitting: *Deleted

## 2019-07-07 ENCOUNTER — Ambulatory Visit: Payer: Self-pay

## 2019-07-07 ENCOUNTER — Encounter: Payer: Self-pay | Admitting: Physical Medicine and Rehabilitation

## 2019-07-07 DIAGNOSIS — M25551 Pain in right hip: Secondary | ICD-10-CM | POA: Diagnosis not present

## 2019-07-07 DIAGNOSIS — M48062 Spinal stenosis, lumbar region with neurogenic claudication: Secondary | ICD-10-CM

## 2019-07-07 DIAGNOSIS — M5441 Lumbago with sciatica, right side: Secondary | ICD-10-CM | POA: Diagnosis not present

## 2019-07-07 DIAGNOSIS — M5416 Radiculopathy, lumbar region: Secondary | ICD-10-CM

## 2019-07-07 DIAGNOSIS — M5442 Lumbago with sciatica, left side: Secondary | ICD-10-CM | POA: Diagnosis not present

## 2019-07-07 DIAGNOSIS — G8929 Other chronic pain: Secondary | ICD-10-CM

## 2019-07-07 NOTE — Telephone Encounter (Signed)
No pa is needed for [id number] per [facility] online portal.

## 2019-07-07 NOTE — Progress Notes (Signed)
  Numeric Pain Rating Scale and Functional Assessment Average Pain 10   In the last MONTH (on 0-10 scale) has pain interfered with the following?  1. General activity like being  able to carry out your everyday physical activities such as walking, climbing stairs, carrying groceries, or moving a chair?  Rating(10)    -Dye Allergies. 

## 2019-07-10 ENCOUNTER — Other Ambulatory Visit: Payer: Self-pay

## 2019-07-10 ENCOUNTER — Ambulatory Visit (INDEPENDENT_AMBULATORY_CARE_PROVIDER_SITE_OTHER): Payer: Medicare Other | Admitting: Physical Medicine and Rehabilitation

## 2019-07-10 ENCOUNTER — Encounter: Payer: Self-pay | Admitting: Physical Medicine and Rehabilitation

## 2019-07-10 ENCOUNTER — Ambulatory Visit: Payer: Self-pay

## 2019-07-10 DIAGNOSIS — M47816 Spondylosis without myelopathy or radiculopathy, lumbar region: Secondary | ICD-10-CM | POA: Diagnosis not present

## 2019-07-10 DIAGNOSIS — M4316 Spondylolisthesis, lumbar region: Secondary | ICD-10-CM | POA: Diagnosis not present

## 2019-07-10 MED ORDER — METHYLPREDNISOLONE ACETATE 80 MG/ML IJ SUSP
80.0000 mg | Freq: Once | INTRAMUSCULAR | Status: AC
  Administered 2019-07-10: 80 mg

## 2019-07-10 MED ORDER — TRIAMCINOLONE ACETONIDE 40 MG/ML IJ SUSP
60.0000 mg | INTRAMUSCULAR | Status: AC | PRN
  Administered 2019-07-07: 60 mg via INTRA_ARTICULAR

## 2019-07-10 MED ORDER — BUPIVACAINE HCL 0.25 % IJ SOLN
4.0000 mL | INTRAMUSCULAR | Status: AC | PRN
  Administered 2019-07-07: 4 mL via INTRA_ARTICULAR

## 2019-07-10 NOTE — Progress Notes (Signed)
Stacey Bryant - 74 y.o. female MRN [id number]  Date of birth: Dec 23, 1945  Office Visit Note: Visit Date: 07/07/2019 PCP: Raynelle Jan., MD Referred by: Raynelle Jan., MD  Subjective: Chief Complaint  Patient presents with  . Lower Back - Pain  . Right Hip - Pain  . Left Hip - Pain   HPI: Stacey Bryant is a 74 y.o. female who comes in today For evaluation and management of chronic worsening severe low back pain with left buttock pain also with right hip and groin pain.  Pain is significantly worse with standing and walking.  She reports pain for over 6 weeks with initial pain in the right hip and groin after stepping in a hole.  She was evaluated by Dr. Norlene Campbell who felt like most of her pain was coming from arthritic changes of the right hip.  He actually completed trochanteric bursa with some mild relief.  At the time she is not having left buttock pain or leg pain or radicular pain.  Dr. Cleophas Dunker had taken x-rays of the lumbar spine initially and this showed almost a grade 2 listhesis of L5 on S1 with what appears to be pars defects.  She has facet arthropathy at L4-5 and L5-S1.  This is when it was noted severe degenerative change of the right hip no real changes on the left mild.  She reports severe 10 out of 10 pain and is really beside herself in the amount of pain and just wants something done immediately which I tried to tell her unfortunately was just hard to do we had planned on potentially doing hip injection today but she is somewhat confused over whether it is her hip or her back I think she is just really at her wits end at this point.  She has been taking tramadol and methocarbamol without much relief.  Her case is complicated by being on Xarelto anticoagulation.  Review of Systems  Musculoskeletal: Positive for back pain and joint pain.  All other systems reviewed and are negative.  Otherwise per HPI.  Assessment & Plan: Visit Diagnoses:  1. Pain in right hip   2.  Spinal stenosis of lumbar region with neurogenic claudication   3. Lumbar radiculopathy   4. Chronic right-sided low back pain with bilateral sciatica     Plan: Findings:  1.  Right hip and groin pain worsened after stepping in a hole and twisting.  X-rays show significant degenerative disease of the hip on the right not so much on the left.  Left range of motion is normal.  Right range of motion is limited she has pain with internal rotation which is Skains consistent with her right hip and groin pain.  We will complete diagnostic and hopefully therapeutic anesthetic hip arthrogram today.  We talked about the fact that she may have more than 1 pain generator and I am sure that the right hip is causing some of her pain.  2.  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  No MRI of the lumbar spine as of yet but I am very quick to order this MRI given the amount of pain that she is having.  I think the most appropriate step is to complete the hip injection today and see how much relief she gets overall.  At that point we can decide to complete a one-time epidural injection which would be a transforaminal approach to the Xarelto.  If that was just not very  beneficial or if we decide at that time because of her pain is worsening then we would look at MRI of the lumbar spine.  Unfortunately she wants everything done very quickly which I understand because of her pain the right now we are sort of flying a little bit blind with her lumbar spine without MRI.  Again she does have significant listhesis that I can only assume some foraminal lateral recess or even central stenosis above the level of the listhesis.    Meds & Orders: No orders of the defined types were placed in this encounter.   Orders Placed This Encounter  Procedures  . Large Joint Inj: R hip joint  . XR C-ARM NO REPORT    Follow-up: Return in about 2 weeks (around 07/21/2019) for Possible transforaminal epidural  steroid injection.   Procedures: Large Joint Inj: R hip joint on 07/07/2019 11:00 AM Indications: pain and diagnostic evaluation Details: 22 G needle, anterior approach  Arthrogram: Yes  Medications: 4 mL bupivacaine 0.25 %; 60 mg triamcinolone acetonide 40 MG/ML Outcome: tolerated well, no immediate complications  Arthrogram demonstrated excellent flow of contrast throughout the joint surface without extravasation or obvious defect.  The patient had relief of symptoms during the anesthetic phase of the injection.  Procedure, treatment alternatives, risks and benefits explained, specific risks discussed. Consent was given by the patient. Immediately prior to procedure a time out was called to verify the correct patient, procedure, equipment, support staff and site/side marked as required. Patient was prepped and draped in the usual sterile fashion.      No notes on file   Clinical History: No specialty comments available.   She reports that she has never smoked. Her smokeless tobacco use includes chew. No results for input(s): HGBA1C, LABURIC in the last 8760 hours.  Objective:  VS:  HT:    WT:   BMI:     BP:   HR: bpm  TEMP: ( )  RESP:  Physical Exam Vitals and nursing note reviewed.  Constitutional:      General: She is not in acute distress.    Appearance: Normal appearance. She is well-developed. She is not ill-appearing.  HENT:     Head: Normocephalic and atraumatic.  Eyes:     Conjunctiva/sclera: Conjunctivae normal.     Pupils: Pupils are equal, round, and reactive to light.  Cardiovascular:     Rate and Rhythm: Normal rate.     Pulses: Normal pulses.  Pulmonary:     Effort: Pulmonary effort is normal.  Musculoskeletal:     Right lower leg: No edema.     Left lower leg: No edema.     Comments: Patient has difficult time going from sit to stand and with any kind of motion or ambulation.  She does have concordant pain with rotation on the right but not the left.   She has good distal strength without clonus.  Skin:    General: Skin is warm and dry.     Findings: No erythema or rash.  Neurological:     General: No focal deficit present.     Mental Status: She is alert and oriented to person, place, and time.     Sensory: No sensory deficit.     Motor: No abnormal muscle tone.     Coordination: Coordination normal.     Gait: Gait normal.  Psychiatric:        Mood and Affect: Mood normal.        Behavior: Behavior  normal.     Ortho Exam  Imaging: No results found.  Past Medical/Family/Surgical/Social History: Medications & Allergies reviewed per EMR, new medications updated. Patient Active Problem List   Diagnosis Date Noted  . Low back pain 06/22/2019  . Trochanteric bursitis, right hip 02/04/2018  . S/P total knee replacement using cement, left 03/30/2017  . Pain in right hip 03/17/2017  . Unilateral primary osteoarthritis, left knee 03/17/2017  . Unilateral primary osteoarthritis, right hip 03/17/2017   Past Medical History:  Diagnosis Date  . Arthritis   . GERD (gastroesophageal reflux disease)   . Headache    HX  MIGRAINES  . Macular degeneration    History reviewed. No pertinent family history. Past Surgical History:  Procedure Laterality Date  . CATARACT EXTRACTION, BILATERAL    . JOINT REPLACEMENT    . KNEE ARTHROSCOPY     BILATERAL   . 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 TOTAL KNEE ARTHROPLASTY Left 03/30/2017   Procedure: LEFT TOTAL KNEE ARTHROPLASTY;  Surgeon: Garald Balding, MD;  Location: [facility];  Service: Orthopedics;  Laterality: Left;   Social History   Occupational History  . Not on file  Tobacco Use  . Smoking status: Never Smoker  . Smokeless tobacco: Current User    Types: Chew  Substance and Sexual Activity  . Alcohol use: Yes    Alcohol/week: 1.0 standard drinks    Types: 1 Glasses of wine per week    Comment: OCC  . Drug use: No  . Sexual activity: Not on file

## 2019-07-10 NOTE — Progress Notes (Signed)
Pt states no major changes since last visit on 07/07/19 and that hip injection is helping.   .Numeric Pain Rating Scale and Functional Assessment Average Pain 4   In the last MONTH (on 0-10 scale) has pain interfered with the following?  1. General activity like being  able to carry out your everyday physical activities such as walking, climbing stairs, carrying groceries, or moving a chair?  Rating(5)   +Driver, -BT, -Dye Allergies.

## 2019-07-11 NOTE — Procedures (Signed)
Lumbar Facet Joint Intra-Articular Injection(s) with Fluoroscopic Guidance  Patient: Stacey Bryant      Date of Birth: 06/05/1945 MRN: [id number] PCP: Raynelle Jan., MD      Visit Date: 07/10/2019   Universal Protocol:    Date/Time: 07/10/2019  Consent Given By: the patient  Position: PRONE   Additional Comments: Vital signs were monitored before and after the procedure. Patient was prepped and draped in the usual sterile fashion. The correct patient, procedure, and site was verified.   Injection Procedure Details:  Procedure Site One Meds Administered:  Meds ordered this encounter  Medications  . methylPREDNISolone acetate (DEPO-MEDROL) injection 80 mg     Laterality: Bilateral  Location/Site:  L5-S1  Needle size: 22 guage  Needle type: Spinal  Needle Placement: Articular  Findings:  -Comments: Excellent flow of contrast producing a partial arthrogram.  Procedure Details: The fluoroscope beam is vertically oriented in AP, and the inferior recess is visualized beneath the lower pole of the inferior apophyseal process, which represents the target point for needle insertion. When direct visualization is difficult the target point is located at the medial projection of the vertebral pedicle. The region overlying each aforementioned target is locally anesthetized with a 1 to 2 ml. volume of 1% Lidocaine without Epinephrine.   The spinal needle was inserted into each of the above mentioned facet joints using biplanar fluoroscopic guidance. A 0.25 to 0.5 ml. volume of Isovue-250 was injected and a partial facet joint arthrogram was obtained. A single spot film was obtained of the resulting arthrogram.    One to 1.25 ml of the steroid/anesthetic solution was then injected into each of the facet joints noted above.   Additional Comments:  The patient tolerated the procedure well Dressing: 2 x 2 sterile gauze and Band-Aid    Post-procedure details: Patient was observed  during the procedure. Post-procedure instructions were reviewed.  Patient left the clinic in stable condition.

## 2019-07-11 NOTE — Progress Notes (Signed)
Stacey Bryant - 74 y.o. female MRN [id number]  Date of birth: 05-18-1945  Office Visit Note: Visit Date: 07/10/2019 PCP: Raynelle Jan., MD Referred by: Raynelle Jan., MD  Subjective: No chief complaint on file.  HPI:  Stacey Bryant is a 74 y.o. female who comes in today For planned bilateral L5-S1 facet joint blocks at the level of the spondylolisthesis and pars defect.  She still has a lot of back pain but her right hip is doing much better after right hip injection.  Please see our prior notes for the details and justification.  Neck step if she is just not getting much better really needs to be MRI of the lumbar spine but patient is very impatient because she says she has a lot of stuff that she needs to do a lot of activities but unfortunately we do not have an MRI of the lumbar spine at this point.  Likely does need some type of epidural injection or potential for surgery unfortunately with almost grade 2 listhesis.  ROS Otherwise per HPI.  Assessment & Plan: Visit Diagnoses:  1. Spondylosis without myelopathy or radiculopathy, lumbar region   2. Spondylolisthesis of lumbar region     Plan: No additional findings.   Meds & Orders:  Meds ordered this encounter  Medications  . methylPREDNISolone acetate (DEPO-MEDROL) injection 80 mg    Orders Placed This Encounter  Procedures  . Facet Injection  . XR C-ARM NO REPORT    Follow-up: Return if symptoms worsen or fail to improve, for Consider lumbar MRI.   Procedures: No procedures performed  Lumbar Facet Joint Intra-Articular Injection(s) with Fluoroscopic Guidance  Patient: Stacey Bryant      Date of Birth: 20-Jan-1946 MRN: [id number] PCP: Raynelle Jan., MD      Visit Date: 07/10/2019   Universal Protocol:    Date/Time: 07/10/2019  Consent Given By: the patient  Position: PRONE   Additional Comments: Vital signs were monitored before and after the procedure. Patient was prepped and draped in the usual sterile  fashion. The correct patient, procedure, and site was verified.   Injection Procedure Details:  Procedure Site One Meds Administered:  Meds ordered this encounter  Medications  . methylPREDNISolone acetate (DEPO-MEDROL) injection 80 mg     Laterality: Bilateral  Location/Site:  L5-S1  Needle size: 22 guage  Needle type: Spinal  Needle Placement: Articular  Findings:  -Comments: Excellent flow of contrast producing a partial arthrogram.  Procedure Details: The fluoroscope beam is vertically oriented in AP, and the inferior recess is visualized beneath the lower pole of the inferior apophyseal process, which represents the target point for needle insertion. When direct visualization is difficult the target point is located at the medial projection of the vertebral pedicle. The region overlying each aforementioned target is locally anesthetized with a 1 to 2 ml. volume of 1% Lidocaine without Epinephrine.   The spinal needle was inserted into each of the above mentioned facet joints using biplanar fluoroscopic guidance. A 0.25 to 0.5 ml. volume of Isovue-250 was injected and a partial facet joint arthrogram was obtained. A single spot film was obtained of the resulting arthrogram.    One to 1.25 ml of the steroid/anesthetic solution was then injected into each of the facet joints noted above.   Additional Comments:  The patient tolerated the procedure well Dressing: 2 x 2 sterile gauze and Band-Aid    Post-procedure details: Patient was observed during the procedure. Post-procedure instructions were reviewed.  Patient  left the clinic in stable condition.     Clinical History: No specialty comments available.     Objective:  VS:  HT:    WT:   BMI:     BP:   HR: bpm  TEMP: ( )  RESP:  Physical Exam  Ortho Exam Imaging: XR C-ARM NO REPORT  Result Date: 07/10/2019 Please see Notes tab for imaging impression.

## 2019-07-17 ENCOUNTER — Encounter: Payer: Self-pay | Admitting: Physical Medicine and Rehabilitation

## 2019-07-17 DIAGNOSIS — M47816 Spondylosis without myelopathy or radiculopathy, lumbar region: Secondary | ICD-10-CM

## 2019-07-19 ENCOUNTER — Other Ambulatory Visit: Payer: Self-pay | Admitting: Physical Medicine and Rehabilitation

## 2019-07-19 DIAGNOSIS — M5416 Radiculopathy, lumbar region: Secondary | ICD-10-CM

## 2019-07-19 MED ORDER — HYDROCODONE-ACETAMINOPHEN 5-325 MG PO TABS: 1.0000 | ORAL_TABLET | Freq: Three times a day (TID) | ORAL | 0 refills | Status: AC | PRN

## 2019-07-19 MED ORDER — PREGABALIN 75 MG PO CAPS: ORAL_CAPSULE | ORAL | 1 refills | Status: DC

## 2019-07-26 ENCOUNTER — Other Ambulatory Visit: Payer: Self-pay

## 2019-07-26 ENCOUNTER — Ambulatory Visit (HOSPITAL_COMMUNITY)
Admission: RE | Admit: 2019-07-26 | Discharge: 2019-07-26 | Disposition: A | Discharge status: Home / Self Care | Payer: Medicare Other | Source: Ambulatory Visit | Attending: Physical Medicine and Rehabilitation | Admitting: Physical Medicine and Rehabilitation

## 2019-07-26 ENCOUNTER — Encounter: Payer: Self-pay | Admitting: Physical Medicine and Rehabilitation

## 2019-07-26 DIAGNOSIS — M47816 Spondylosis without myelopathy or radiculopathy, lumbar region: Secondary | ICD-10-CM | POA: Diagnosis not present

## 2019-07-26 DIAGNOSIS — M8448XA Pathological fracture, other site, initial encounter for fracture: Secondary | ICD-10-CM

## 2019-07-27 ENCOUNTER — Other Ambulatory Visit (HOSPITAL_COMMUNITY): Payer: Self-pay | Admitting: Interventional Radiology

## 2019-07-27 DIAGNOSIS — M8448XA Pathological fracture, other site, initial encounter for fracture: Secondary | ICD-10-CM

## 2019-07-27 NOTE — Progress Notes (Signed)
MRI of the lumbar spine reviewed yesterday.  This did show the grade 2 listhesis with pars defects at L5-S1 with bilateral foraminal narrowing but really no high-grade central stenosis or other worrisome problems.  There was a finding of bilateral sacral insufficiency fractures.  This likely represents the reason for the amount of pain she is having with standing and it is across the lumbosacral junction.  I did speak with Dr. Norlene Campbell and he agreed that she needs to be referred to Kerby Nora, MD at [facility] radiology or any of the interventional radiologist that could possibly be sacral plasty.  I did speak with the patient on the phone and we discussed the plan going forward and she does agree with this.

## 2019-07-31 ENCOUNTER — Other Ambulatory Visit (HOSPITAL_COMMUNITY): Payer: Self-pay | Admitting: Interventional Radiology

## 2019-07-31 DIAGNOSIS — M8448XA Pathological fracture, other site, initial encounter for fracture: Secondary | ICD-10-CM

## 2019-08-01 ENCOUNTER — Ambulatory Visit (HOSPITAL_COMMUNITY)
Admission: RE | Admit: 2019-08-01 | Discharge: 2019-08-01 | Disposition: A | Discharge status: Home / Self Care | Payer: BC Managed Care – PPO | Source: Ambulatory Visit | Attending: Interventional Radiology | Admitting: Interventional Radiology

## 2019-08-01 ENCOUNTER — Other Ambulatory Visit: Payer: Self-pay

## 2019-08-01 DIAGNOSIS — M8448XA Pathological fracture, other site, initial encounter for fracture: Secondary | ICD-10-CM

## 2019-08-01 NOTE — Consult Note (Addendum)
Chief Complaint: Patient was seen in consultation today for bilateral sacral insufficiency fractures.  Referring Physician(s): Magnus Sinning  Supervising Physician: Luanne Bras  Patient Status: [facility] - Out-pt  History of Present Illness: Stacey Bryant is a 74 y.o. female with a past medical history as below, with pertinent past medical history including migraines, GERD, macular degeneration, arthritis, and osteopenia. She has had chronic low pain back for weeks-months, managed by Dr. Ernestina Patches. Recent MR lumbar spine revealed bilateral sacral insufficiency fractures.  MR lumbar spine 07/26/2019: 1. Bilateral sacral insufficiency fractures. 2. 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 This in severe narrowing of the left neural foramen at this level impinging on the exiting left L5 nerve root. 3. Right central/subarticular disc protrusion at L1-L2 resulting in effacement of the right subarticular zone and severe right neural foraminal narrowing.  NIR consulted by Dr. Ernestina Patches for management of bilateral sacral insufficiency fractures. Patient awake and alert sitting in chair. Complains of low back pain. States movement exacerbates pain. Rates pain as 10/10 at its worst. States she takes OTC Tylenol/Advil for pain management. Denies fever, chills, chest pain, dyspnea, abdominal pain, or headache.   Past Medical History:  Diagnosis Date  . Arthritis   . GERD (gastroesophageal reflux disease)   . Headache    HX  MIGRAINES  . Macular degeneration     Past Surgical History:  Procedure Laterality Date  . CATARACT EXTRACTION, BILATERAL    . JOINT REPLACEMENT    . KNEE ARTHROSCOPY     BILATERAL   . 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 Authorizing Provider  acetaminophen (TYLENOL) 650 MG CR tablet Take 1,300 mg by mouth daily.  08/24/16   [provider]  cholecalciferol (VITAMIN D) 1000 units tablet Take 1,000 Units by mouth daily.    [provider]  clindamycin (CLEOCIN) 300 MG capsule Take two (300mg ) tablets prior to dental procedure (600mg  total.) 05/14/17   Garald Balding, MD  pregabalin (LYRICA) 75 MG capsule Take 1 capsule at night for 7 days, then 1 capsule 2 times a day 07/19/19   Magnus Sinning, MD  RESTASIS 0.05 % ophthalmic emulsion INSTILL 1 DROP INTO BOTH EYES TWICE DAILY AS DIRECTED. 12/16/17   [provider]  traMADol (ULTRAM) 50 MG tablet Take 1 tablet (50 mg total) by mouth 3 (three) times daily as needed. 04/12/17   Garald Balding, MD     No family history on file.  Social History   Socioeconomic History  . Marital status: Divorced    Spouse name: Not on file  . Number of children: Not on file  . Years of education: Not on file  . Highest education level: Not on file  Occupational History  . Not on file  Tobacco Use  . Smoking status: Never Smoker  . Smokeless tobacco: Current User    Types: Chew  Substance and Sexual Activity  . Alcohol use: Yes    Alcohol/week: 1.0 standard drinks    Types: 1 Glasses of wine per week    Comment: OCC  . Drug use: No  . Sexual activity: Not on file  Other Topics Concern  . Not on file  Social History Narrative  . Not on file   Social Determinants of Health   Financial Resource Strain:   . Difficulty of Paying Living Expenses:   Food Insecurity:   . Worried About Programme researcher, broadcasting/film/video in the Last Year:   . Barista in the Last Year:   Transportation Needs:   . Freight forwarder (Medical):   Marland Kitchen Lack of Transportation (Non-Medical):   Physical Activity:   . Days of Exercise per Week:   . Minutes of Exercise per  Session:   Stress:   . Feeling of Stress :   Social Connections:   . Frequency of Communication with Friends and Family:   . Frequency of Social Gatherings with Friends and Family:   . Attends Religious Services:   . Active Member of Clubs or Organizations:   . Attends Banker Meetings:   Marland Kitchen Marital Status:      Review of Systems: A 12 point ROS discussed and pertinent positives are indicated in the HPI above.  All other systems are negative.  Review of Systems  Constitutional: Negative for chills and fever.  Respiratory: Negative for shortness of breath and wheezing.   Cardiovascular: Negative for chest pain and palpitations.  Gastrointestinal: Negative for abdominal pain.  Musculoskeletal: Positive for back pain.  Neurological: Negative for headaches.  Psychiatric/Behavioral: Negative for behavioral problems and confusion.    Vital Signs: There were no vitals taken for this visit.  Physical Exam Constitutional:      General: She is not in acute distress.    Appearance: Normal appearance.  Pulmonary:     Effort: Pulmonary effort is normal. No respiratory distress.  Skin:    General: Skin is warm and dry.  Neurological:     Mental Status: She is alert and oriented to person, place, and time.  Psychiatric:        Mood and Affect: Mood normal.        Behavior: Behavior normal.      Imaging: MR LUMBAR SPINE WO CONTRAST  Result Date: 07/26/2019 CLINICAL DATA:  Low back pain. EXAM: MRI LUMBAR SPINE WITHOUT CONTRAST TECHNIQUE: Multiplanar, multisequence MR imaging of the lumbar spine was performed. No intravenous contrast was administered. COMPARISON:  Plain films June 22, 2019 FINDINGS: Segmentation:  Standard. Alignment: There is an 11 mm anterolisthesis of L5 over S1 related to bilateral pars defect. There is also minimal retrolisthesis of L1 over L2. Vertebrae: Prominent edema with in the sacrum bilaterally consistent with insufficiency fracture. No fracture  of the lumbar vertebrae, evidence of discitis or aggressive bone lesion. Conus medullaris and cauda equina: Conus extends to the T12-L1. Level. Conus and cauda equina appear normal. Paraspinal and other soft tissues: 4 cm left renal cyst. Disc levels: T11-12: Posterior disc protrusion causing indentation on the anterior CSF space. No spinal canal or neural foraminal stenosis. T12-L1: Disc bulge and mild facet degenerative changes without significant spinal canal or neural foraminal stenosis. L1-2: Disc bulge with superimposed right central/subarticular disc protrusion resulting in effacement of the right subarticular zone, severe right and moderate left neural foraminal narrowing. L2-3: Right asymmetric disc bulge, facet degenerative changes ligamentum flavum redundancy resulting in narrowing of the right subarticular zone, mild to moderate right and mild left neural foraminal narrowing. L3-4: Disc bulge, facet degenerative changes and ligamentum flavum redundancy which is more pronounced on the right side, resulting in mild narrowing of the right subarticular zone and mild bilateral neural foraminal narrowing. L4-5: Disc bulge, mild  facet degenerative changes and ligamentum flavum redundancy resulting in mild bilateral neural foraminal narrowing. No significant spinal canal stenosis. L5-S1: Bilateral pars defect, uncovering of the disc and mild facet degenerative changes. Findings result in elongation of the AP dimension of the spinal canal with severe narrowing of the left neural foramen impinging on the exiting left L5 nerve root. There is also mild-to-moderate right neural foraminal narrowing. No spinal canal stenosis. IMPRESSION: 1. Bilateral sacral insufficiency fractures. 2. 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 This in severe narrowing of the left neural foramen at this level impinging on the exiting left L5 nerve root. 3. Right central/subarticular disc protrusion at L1-L2 resulting  in effacement of the right subarticular zone and severe right neural foraminal narrowing. These results will be called to the ordering clinician or representative by the Radiologist Assistant, and communication documented in the PACS or [employer]. Electronically Signed   By: Baldemar Lenis M.D.   On: 07/26/2019 15:39   XR C-ARM NO REPORT  Result Date: 07/10/2019 Please see Notes tab for imaging impression.  XR C-ARM NO REPORT  Result Date: 07/07/2019 Please see Notes tab for imaging impression.   Assessment and Plan:  Bilateral sacral insufficiency fractures, seen on MR lumbar spine 07/26/2019. Dr. Corliss Skains was present for consultation.  Discussed findings of recent MR lumbar spine- specifically bilateral sacral insufficiency fractures. Explained there are two management options moving forward- either continued conservative management (medications for pain management, PT) or with a procedure called a kyphoplasty/vertebroplasty. Explained procedure, including risks and benefits. Explained the indication of this procedure is to stabilize her fractures and decrease her pain. Patient expresses desire to move forward with procedure. Plan for follow-up with an image-guided bilateral sacroplasty, first available. Informed patient that our schedulers will call her to set up this procedure.  All questions answered and concerns addressed. Patient conveys understanding and agrees with plan.  Thank you for this interesting consult.  I greatly enjoyed meeting Zeppelin Beckstrand and look forward to participating in their care.  A copy of this report was sent to the requesting provider on this date.  Electronically Signed: Elwin Mocha, PA-C 08/01/2019, 9:01 AM   I spent a total of 30 Minutes in face to face in clinical consultation, greater than 50% of which was counseling/coordinating care for bilateral sacral insufficiency fractures.

## 2019-08-04 ENCOUNTER — Telehealth (HOSPITAL_COMMUNITY): Payer: Self-pay | Admitting: Radiology

## 2019-08-04 ENCOUNTER — Telehealth: Payer: Self-pay | Admitting: Student

## 2019-08-04 NOTE — Telephone Encounter (Signed)
NIR.  Received message from insurance company [facility]) stating that patient's procedure (sacroplasty) was denied secondary to 1- procedure not a covered benefit under the member plan and 2- not enough studies have been done to prove procedure relieves back pain. Advised to do peer-peer for approval [phone]; reference #[id number]).  Dr. Corliss Skains called above number to attempt peer-peer. Per [employer], patient is not eligible for peer-peer on plan she has- patient can only place an appeal. Per [employer], letter for appeal has been sent to patient.  At this time, procedure has been denied without possibility of peer-peer with treating physician. Message sent to schedulers to inform patient on above.   Waylan Boga Cherrise Occhipinti, PA-C 08/04/2019, 2:51 PM

## 2019-08-04 NOTE — Telephone Encounter (Signed)
Called pt to let her know that per Dr. Corliss Skains her insurance company will not do a peer-to-peer with him concerning her denial of coverage for a sacroplasty. Pt states she is going to contact her son, who is a Clinical research associate as well as her insurance company. She will call back and let us know what she decides. JM

## 2019-09-06 ENCOUNTER — Other Ambulatory Visit: Payer: Self-pay | Admitting: Student

## 2019-09-07 ENCOUNTER — Encounter (HOSPITAL_COMMUNITY): Payer: Self-pay

## 2019-09-07 ENCOUNTER — Ambulatory Visit (HOSPITAL_COMMUNITY)
Admission: RE | Admit: 2019-09-07 | Discharge: 2019-09-07 | Disposition: A | Discharge status: Home / Self Care | Payer: Medicare Other | Source: Ambulatory Visit | Attending: Interventional Radiology | Admitting: Interventional Radiology

## 2019-09-07 ENCOUNTER — Other Ambulatory Visit: Payer: Self-pay

## 2019-09-07 DIAGNOSIS — K219 Gastro-esophageal reflux disease without esophagitis: Secondary | ICD-10-CM | POA: Diagnosis not present

## 2019-09-07 DIAGNOSIS — Z880 Allergy status to penicillin: Secondary | ICD-10-CM | POA: Diagnosis not present

## 2019-09-07 DIAGNOSIS — M8448XA Pathological fracture, other site, initial encounter for fracture: Secondary | ICD-10-CM | POA: Diagnosis present

## 2019-09-07 HISTORY — PX: IR SACROPLASTY BILATERAL: IMG5561

## 2019-09-07 LAB — URINALYSIS, COMPLETE (UACMP) WITH MICROSCOPIC
Bacteria, UA: NONE SEEN
Bilirubin Urine: NEGATIVE
Glucose, UA: NEGATIVE mg/dL
Ketones, ur: NEGATIVE mg/dL
Nitrite: NEGATIVE
Protein, ur: NEGATIVE mg/dL
Specific Gravity, Urine: 1.013 (ref 1.005–1.030)
pH: 5 (ref 5.0–8.0)

## 2019-09-07 LAB — CBC WITH DIFFERENTIAL/PLATELET
Abs Immature Granulocytes: 0.02 10*3/uL (ref 0.00–0.07)
Basophils Absolute: 0.1 10*3/uL (ref 0.0–0.1)
Basophils Relative: 2 %
Eosinophils Absolute: 0.1 10*3/uL (ref 0.0–0.5)
Eosinophils Relative: 2 %
HCT: 41 % (ref 36.0–46.0)
Hemoglobin: 13 g/dL (ref 12.0–15.0)
Immature Granulocytes: 0 %
Lymphocytes Relative: 40 %
Lymphs Abs: 2.1 10*3/uL (ref 0.7–4.0)
MCH: 27.6 pg (ref 26.0–34.0)
MCHC: 31.7 g/dL (ref 30.0–36.0)
MCV: 87 fL (ref 80.0–100.0)
Monocytes Absolute: 0.5 10*3/uL (ref 0.1–1.0)
Monocytes Relative: 10 %
Neutro Abs: 2.4 10*3/uL (ref 1.7–7.7)
Neutrophils Relative %: 46 %
Platelets: 265 10*3/uL (ref 150–400)
RBC: 4.71 MIL/uL (ref 3.87–5.11)
RDW: 13.6 % (ref 11.5–15.5)
WBC: 5.2 10*3/uL (ref 4.0–10.5)
nRBC: 0 % (ref 0.0–0.2)

## 2019-09-07 LAB — POCT I-STAT, CHEM 8
BUN: 9 mg/dL (ref 8–23)
Calcium, Ion: 1.28 mmol/L (ref 1.15–1.40)
Chloride: 104 mmol/L (ref 98–111)
Creatinine, Ser: 0.6 mg/dL (ref 0.44–1.00)
Glucose, Bld: 92 mg/dL (ref 70–99)
HCT: 37 % (ref 36.0–46.0)
Hemoglobin: 12.6 g/dL (ref 12.0–15.0)
Potassium: 3.9 mmol/L (ref 3.5–5.1)
Sodium: 144 mmol/L (ref 135–145)
TCO2: 26 mmol/L (ref 22–32)

## 2019-09-07 LAB — PROTIME-INR
INR: 1.1 (ref 0.8–1.2)
Prothrombin Time: 13.6 seconds (ref 11.4–15.2)

## 2019-09-07 LAB — APTT: aPTT: 38 seconds — ABNORMAL HIGH (ref 24–36)

## 2019-09-07 MED ORDER — MIDAZOLAM HCL 2 MG/2ML IJ SOLN
INTRAMUSCULAR | Status: AC
  Filled 2019-09-07: qty 2

## 2019-09-07 MED ORDER — BUPIVACAINE HCL (PF) 0.5 % IJ SOLN
INTRAMUSCULAR | Status: AC
  Filled 2019-09-07: qty 30

## 2019-09-07 MED ORDER — FENTANYL CITRATE (PF) 100 MCG/2ML IJ SOLN
INTRAMUSCULAR | Status: AC | PRN
  Administered 2019-09-07 (×2): 25 ug via INTRAVENOUS

## 2019-09-07 MED ORDER — GELATIN ABSORBABLE 12-7 MM EX MISC
CUTANEOUS | Status: AC
  Filled 2019-09-07: qty 1

## 2019-09-07 MED ORDER — TOBRAMYCIN SULFATE 1.2 G IJ SOLR
INTRAMUSCULAR | Status: AC
  Filled 2019-09-07: qty 1.2

## 2019-09-07 MED ORDER — VANCOMYCIN HCL IN DEXTROSE 1-5 GM/200ML-% IV SOLN
INTRAVENOUS | Status: AC
  Administered 2019-09-07: 1000 mg via INTRAVENOUS
  Filled 2019-09-07: qty 200

## 2019-09-07 MED ORDER — HYDROMORPHONE HCL 1 MG/ML IJ SOLN
INTRAMUSCULAR | Status: AC
  Filled 2019-09-07: qty 1

## 2019-09-07 MED ORDER — VANCOMYCIN HCL IN DEXTROSE 1-5 GM/200ML-% IV SOLN: 1000.0000 mg | Freq: Once | INTRAVENOUS | Status: AC

## 2019-09-07 MED ORDER — MIDAZOLAM HCL 2 MG/2ML IJ SOLN
INTRAMUSCULAR | Status: AC | PRN
  Administered 2019-09-07 (×2): 1 mg via INTRAVENOUS

## 2019-09-07 MED ORDER — IOHEXOL 300 MG/ML  SOLN
50.0000 mL | Freq: Once | INTRAMUSCULAR | Status: AC | PRN
  Administered 2019-09-07: 10 mL via INTRATHECAL

## 2019-09-07 MED ORDER — SODIUM CHLORIDE 0.9 % IV SOLN: INTRAVENOUS | Status: AC

## 2019-09-07 MED ORDER — FENTANYL CITRATE (PF) 100 MCG/2ML IJ SOLN
INTRAMUSCULAR | Status: AC
  Filled 2019-09-07: qty 2

## 2019-09-07 MED ORDER — SODIUM CHLORIDE 0.9 % IV SOLN: INTRAVENOUS | Status: DC

## 2019-09-07 NOTE — H&P (Signed)
Chief Complaint: Patient was seen in consultation today for bilateral sacral insufficiency fractures  Referring Physician(s): Dr. Ernestina Patches  Supervising Physician: Luanne Bras  Patient Status: [facility] - Out-pt  History of Present Illness: Stacey Bryant is a 74 y.o. female with past medical history of migraines, GERD, macular degeneration, arthritis, osteopenia, chronic low back pain who experience worsening of her back pain in April after "stepping wrong" on uneven ground while walking her dog. She was found to have bilateral sacral insufficiency fractures on MR Lumbar Spine 07/26/19.  She met with Dr. Estanislado Pandy in consultation 08/01/19 to discussed treatment and management of her fractures.  She was found to be an appropriate candidate for the procedure, however due to delay in insurance coverage, had not been able to be scheduled for the procedure. She now has insurance approval and is interested in moving forward with procedure. [name] presents today in her usual state of health.  She reports she is nervous.  She continues to have back pain.  She has been NPO.  She does not take blood thinners.  She has assistance from her granddaughter today for recovery.  Past Medical History:  Diagnosis Date  . Arthritis   . GERD (gastroesophageal reflux disease)   . Headache    HX  MIGRAINES  . Macular degeneration     Past Surgical History:  Procedure Laterality Date  . CATARACT EXTRACTION, BILATERAL    . JOINT REPLACEMENT    . KNEE ARTHROSCOPY     BILATERAL   . 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 Authorizing Provider  acetaminophen (TYLENOL) 650 MG CR tablet Take 1,300 mg by mouth  daily.  08/24/16  Yes [provider]  ascorbic acid (VITAMIN C) 500 MG tablet Take 500 mg by mouth daily.   Yes [provider]  cholecalciferol (VITAMIN D) 1000 units tablet Take 1,000 Units by mouth daily.   Yes [provider]  Multiple Vitamins-Minerals (PRESERVISION AREDS 2+MULTI VIT PO) Take 1 capsule by mouth in the morning and at bedtime.   Yes [provider]  RESTASIS 0.05 % ophthalmic emulsion Place 1 drop into both eyes 2 (two) times daily.  12/16/17  Yes [provider]  clindamycin (CLEOCIN) 300 MG capsule Take two (345m) tablets prior to dental procedure (603mtotal.) 05/14/17   WhGarald BaldingMD     History reviewed. No pertinent family history.  Social History   Socioeconomic History  . Marital status: Divorced    Spouse name: Not on file  . Number of children: Not on file  . Years of education: Not on file  . Highest education level: Not on file  Occupational History  . Not on file  Tobacco Use  . Smoking status: Never Smoker  . Smokeless tobacco: Current User    Types: Chew  Vaping Use  . Vaping Use: Never used  Substance and Sexual Activity  . Alcohol use: Yes    Alcohol/week: 1.0 standard drink    Types: 1 Glasses of wine per week    Comment: OCC  . Drug use: No  . Sexual activity: Not on file  Other Topics Concern  . Not on file  Social History Narrative  .  Not on file   Social Determinants of Health   Financial Resource Strain:   . Difficulty of Paying Living Expenses:   Food Insecurity:   . Worried About Charity fundraiser in the Last Year:   . Arboriculturist in the Last Year:   Transportation Needs:   . Film/video editor (Medical):   Marland Kitchen Lack of Transportation (Non-Medical):   Physical Activity:   . Days of Exercise per Week:   . Minutes of Exercise per Session:   Stress:   . Feeling of Stress :   Social Connections:   . Frequency of Communication with Friends and Family:   .  Frequency of Social Gatherings with Friends and Family:   . Attends Religious Services:   . Active Member of Clubs or Organizations:   . Attends Archivist Meetings:   Marland Kitchen Marital Status:      Review of Systems: A 12 point ROS discussed and pertinent positives are indicated in the HPI above.  All other systems are negative.  Review of Systems  Constitutional: Negative for fatigue and fever.  Respiratory: Negative for cough and shortness of breath.   Cardiovascular: Negative for chest pain.  Gastrointestinal: Negative for abdominal pain, diarrhea and nausea.  Genitourinary: Negative for dysuria.  Musculoskeletal: Positive for back pain and gait problem (currently using a cane to support back).  Psychiatric/Behavioral: Negative for behavioral problems and confusion.    Vital Signs: BP (!) 151/76   Pulse 93   Temp 98.2 F (36.8 C) (Skin)   Resp 14   Ht '5\' 6"'  (1.676 m)   Wt 170 lb (77.1 kg)   SpO2 100%   BMI 27.44 kg/m   Physical Exam Vitals and nursing note reviewed.  Constitutional:      General: She is not in acute distress.    Appearance: Normal appearance. She is not ill-appearing.  HENT:     Mouth/Throat:     Mouth: Mucous membranes are moist.     Pharynx: Oropharynx is clear.  Cardiovascular:     Rate and Rhythm: Normal rate and regular rhythm.  Pulmonary:     Effort: Pulmonary effort is normal. No respiratory distress.     Breath sounds: Normal breath sounds.  Abdominal:     General: Abdomen is flat.     Palpations: Abdomen is soft.  Musculoskeletal:        General: Tenderness (lower spine tenderness) present.  Skin:    General: Skin is warm and dry.  Neurological:     General: No focal deficit present.     Mental Status: She is alert and oriented to person, place, and time. Mental status is at baseline.  Psychiatric:        Mood and Affect: Mood normal.        Behavior: Behavior normal.        Thought Content: Thought content normal.         Judgment: Judgment normal.      MD Evaluation Airway: WNL Heart: WNL Abdomen: WNL Chest/ Lungs: WNL ASA  Classification: 2 Mallampati/Airway Score: Two   Imaging: No results found.  Labs:  CBC: No results for input(s): WBC, HGB, HCT, PLT in the last 8760 hours.  COAGS: No results for input(s): INR, APTT in the last 8760 hours.  BMP: No results for input(s): NA, K, CL, CO2, GLUCOSE, BUN, CALCIUM, CREATININE, GFRNONAA, GFRAA in the last 8760 hours.  Invalid input(s): CMP  LIVER FUNCTION TESTS: No results for  input(s): BILITOT, AST, ALT, ALKPHOS, PROT, ALBUMIN in the last 8760 hours.  TUMOR MARKERS: No results for input(s): AFPTM, CEA, CA199, CHROMGRNA in the last 8760 hours.  Assessment and Plan: Patient with past medical history of osteopenia, back pain presents with complaint of bilateral sacral fractures.  IR consulted for sacroplasty at the request of Dr. Ernestina Patches. Case reviewed by Dr. Estanislado Pandy who approves patient for procedure.  Patient presents today in their usual state of health.  She has been NPO and is not currently on blood thinners.   Risks and benefits of scaroplasty were discussed with the patient including, but not limited to education regarding the natural healing process of compression fractures without intervention, bleeding, infection, cement migration which may cause spinal cord damage, paralysis, pulmonary embolism or even death.  This interventional procedure involves the use of X-rays and because of the nature of the planned procedure, it is possible that we will have prolonged use of X-ray fluoroscopy.  Potential radiation risks to you include (but are not limited to) the following: - A slightly elevated risk for cancer  several years later in life. This risk is typically less than 0.5% percent. This risk is low in comparison to the normal incidence of human cancer, which is 33% for women and 50% for men according to the [place]. - Radiation induced injury can include skin redness, resembling a rash, tissue breakdown / ulcers and hair loss (which can be temporary or permanent).   The likelihood of either of these occurring depends on the difficulty of the procedure and whether you are sensitive to radiation due to previous procedures, disease, or genetic conditions.   IF your procedure requires a prolonged use of radiation, you will be notified and given written instructions for further action.  It is your responsibility to monitor the irradiated area for the 2 weeks following the procedure and to notify your physician if you are concerned that you have suffered a radiation induced injury.    All of the patient's questions were answered, patient is agreeable to proceed.  Consent signed and in chart.  Thank you for this interesting consult.  I greatly enjoyed meeting Danniell Rotundo and look forward to participating in their care.  A copy of this report was sent to the requesting provider on this date.  Electronically Signed: Docia Barrier, PA 09/07/2019, 11:27 AM   I spent a total of    15 Minutes in face to face in clinical consultation, greater than 50% of which was counseling/coordinating care for sacroplasty.

## 2019-09-07 NOTE — Discharge Instructions (Addendum)
1. No stooping,bending or lifting more than 10 lbs for 2 weeks. °2.Use walker to ambulate for  2 weeks. °3.No driving for 2 weeks. °4. RTC PRN 2 weeks. °KYPHOPLASTY/VERTEBROPLASTY DISCHARGE INSTRUCTIONS ° °Medications: (check all that apply) ° °   Resume all home medications as before procedure. °   °Continue your pain medications as prescribed as needed.  Over the next 3-5 days, decrease your pain medication as tolerated.  Over the counter medications (i.e. Tylenol, ibuprofen, and aleve) may be substituted once severe/moderate pain symptoms have subsided. ° ° Wound Care: °- Bandages may be removed the day following your procedure.  You may get your incision wet once bandages are removed.  Bandaids may be used to cover the incisions until scab formation.  Topical ointments are optional. ° °- If you develop a fever greater than 101 degrees, have increased skin redness at the incision sites or pus-like oozing from incisions occurring within 1 week of the procedure, contact radiology at [phone] or [phone]. ° °- Ice pack to back for 15-20 minutes 2-3 time per day for first 2-3 days post procedure.  The ice will expedite muscle healing and help with the pain from the incisions. ° ° Activity: °- Bedrest today with limited activity for 24 hours post procedure. ° °- No driving for 48 hours. ° °- Increase your activity as tolerated after bedrest (with assistance if necessary). ° °- Refrain from any strenuous activity or heavy lifting (greater than 10 lbs.). ° ° Follow up: °- Contact radiology at [phone] or [phone] if any questions/concerns. ° °- A physician assistant from radiology will contact you in approximately 1 week. ° °- If a biopsy was performed at the time of your procedure, your referring physician should receive the results in usually 2-3 days. ° ° ° ° ° ° ° ° °

## 2019-09-07 NOTE — Progress Notes (Signed)
Patient and grand daughter was given discharge instructions. Both verbalized understanding. 

## 2019-09-07 NOTE — Procedures (Signed)
S/P S1 sacroplasty bilateral approach. S.Jodeen Mclin MD

## 2019-09-14 ENCOUNTER — Telehealth (HOSPITAL_COMMUNITY): Payer: Self-pay

## 2019-09-14 NOTE — Telephone Encounter (Signed)
Pt called to see if she needed to schedule a 2 week f/u. Informed her that if she was doing better then she did not need to have a f/u. She stated that she was feeling much better and improving day by day. She will call us to schedule a f/u if things change. AW

## 2019-10-04 ENCOUNTER — Telehealth: Payer: Self-pay | Admitting: Orthopaedic Surgery

## 2019-10-04 NOTE — Telephone Encounter (Signed)
Patient called requesting a call back. Patient states she has medical questions about exercises she is allowed to do. Please call patient about this matter at [phone].

## 2019-10-04 NOTE — Telephone Encounter (Signed)
Any limitations to exercises that patient can and cannot do?

## 2019-10-04 NOTE — Telephone Encounter (Signed)
Spoke with patient. She had a sacroplasty with Dr.Deveswhar on 09/07/2019. She is doing well, but wanted to make sure that she did not need a follow up and also to see if she had any limitations in exercise. I spoke with Dr.Whitfield and he states that she does not need to follow up and to use pain as her guide. If she hurts, don't do it. Otherwise there are no limitations. Patient understands and is aware to call with any other questions or concerns.

## 2019-10-04 NOTE — Telephone Encounter (Signed)
No limitations based on her back discomfort

## 2020-11-26 IMAGING — XA IR SACRALPLASTY INJ BILAT
1 series · 14 of 24 positions shown · IV contrast (IODINE)
Comparison: none

INDICATION: Severe low back pain secondary to sacral insufficiency fractures.
TECHNIQUE: Informed written consent was obtained from the patient after a
thorough discussion of the procedural risks, benefits and
alternatives. All questions were addressed. Maximal Sterile Barrier
Technique was utilized including caps, mask, sterile gowns, sterile
gloves, sterile drape, hand hygiene and skin antiseptic. A timeout
was performed prior to the initiation of the procedure.

[Series 300: spine · 14 of 42 slices shown]
[im 1/42]
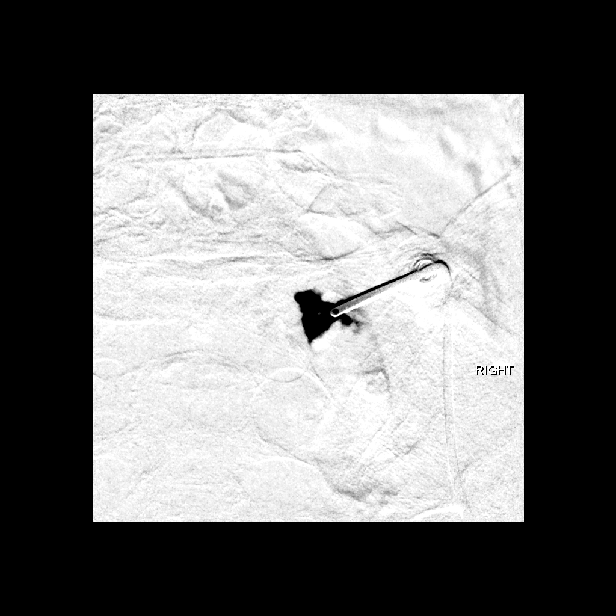
[im 4/42]
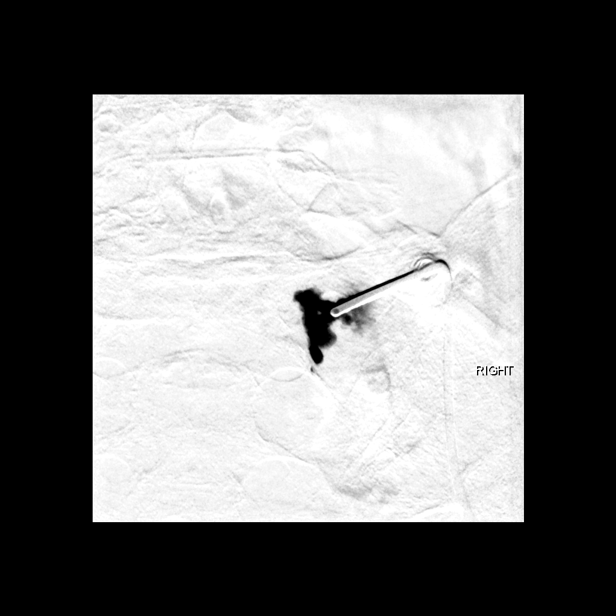
[im 8/42]
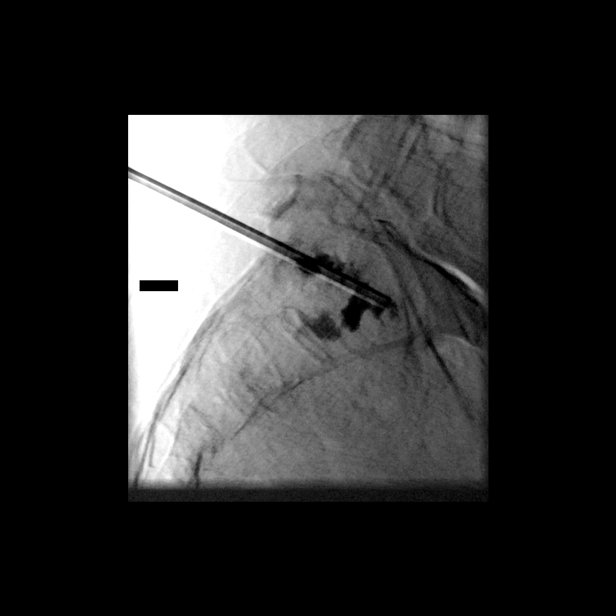
[im 11/42]
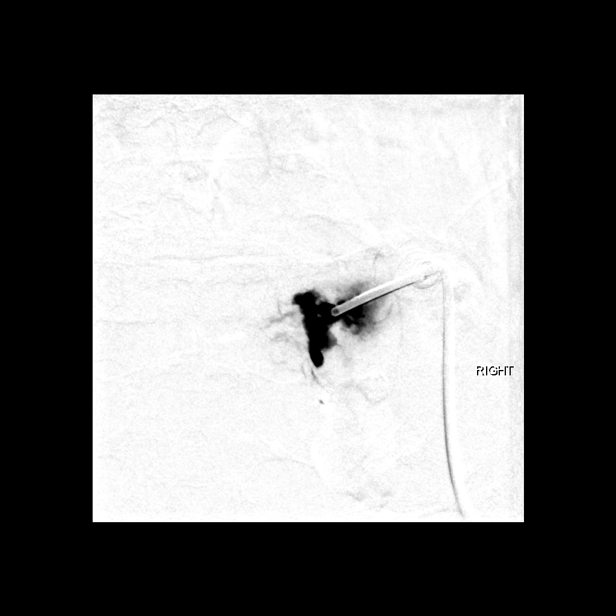
[im 13/42]
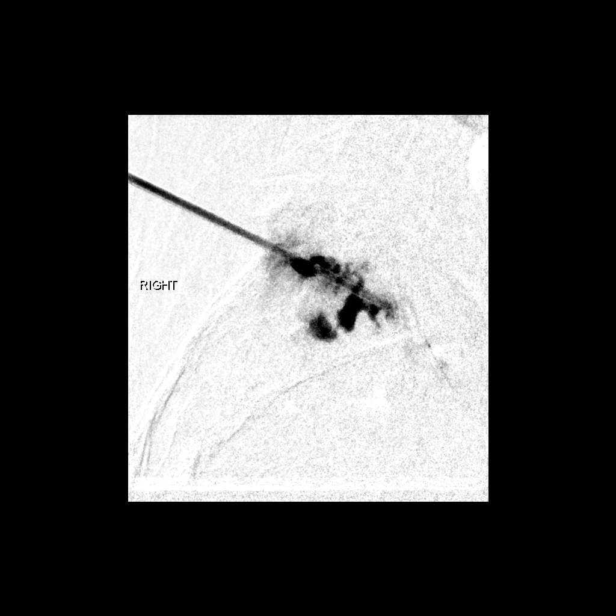
[im 17/42]
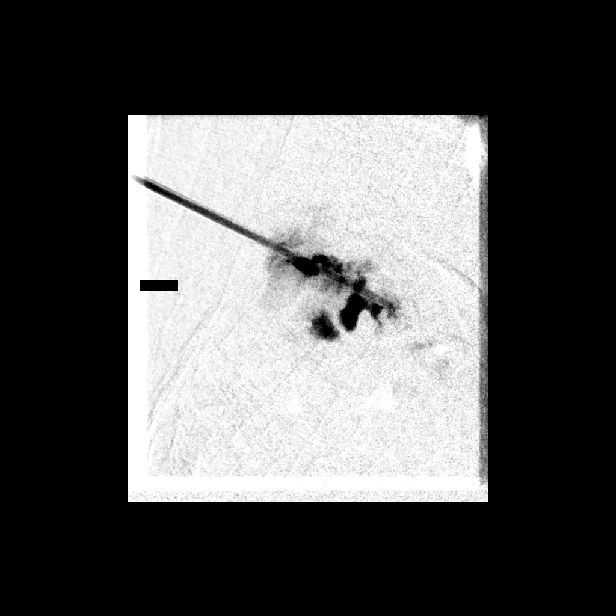
[im 20/42]
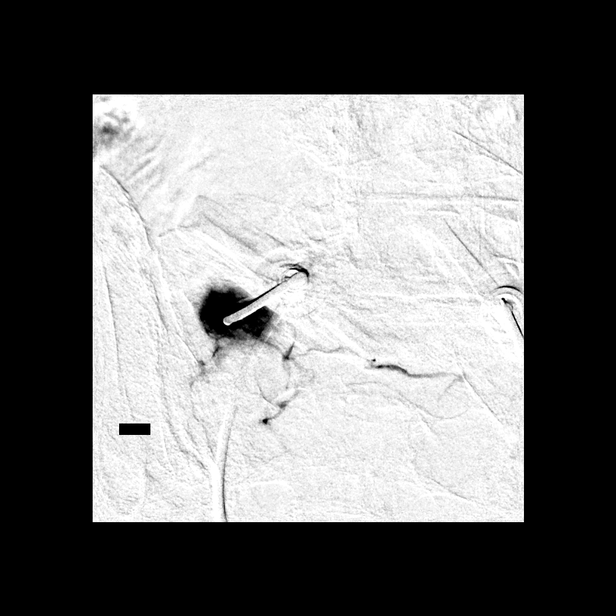
[im 22/42]
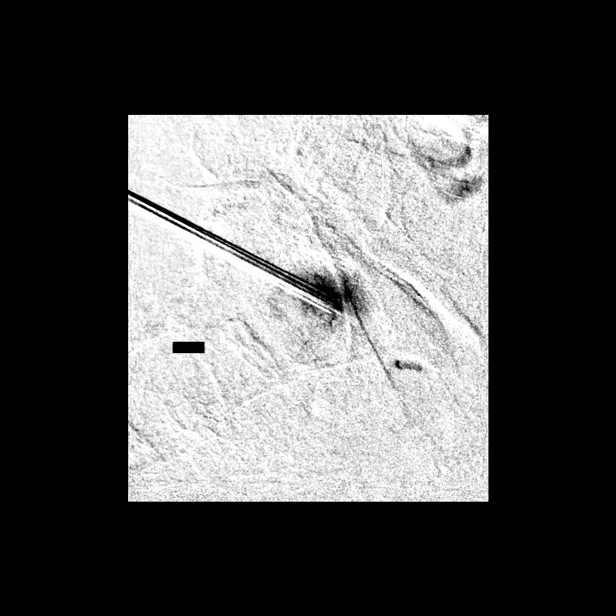
[im 25/42]
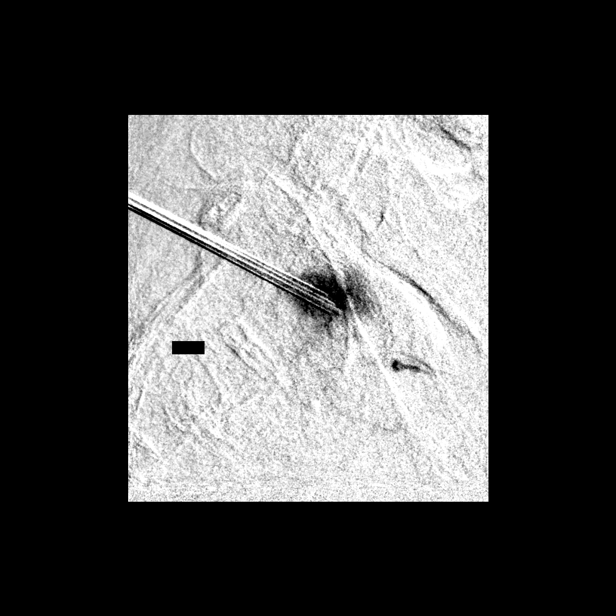
[im 29/42]
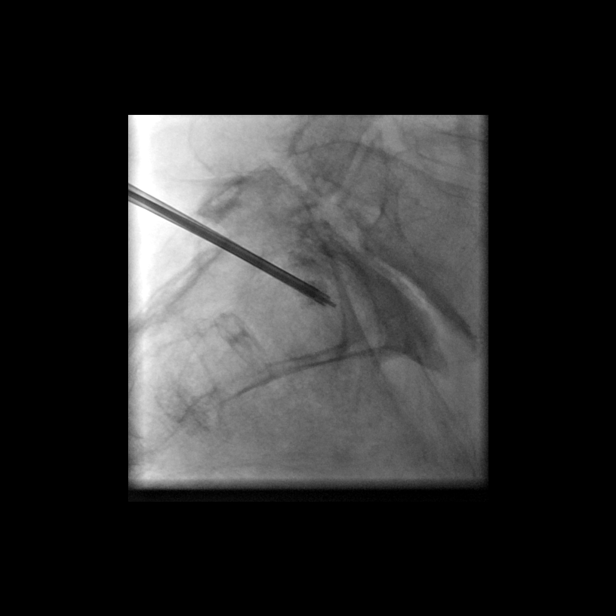
[im 33/42]
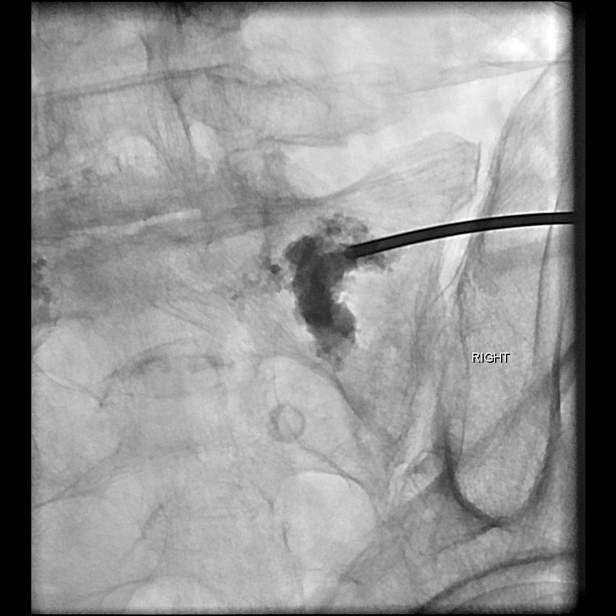
[im 34/42]
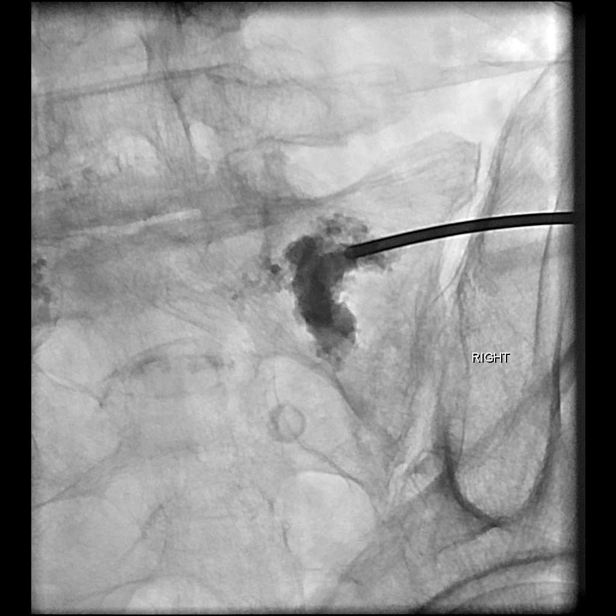
[im 38/42]
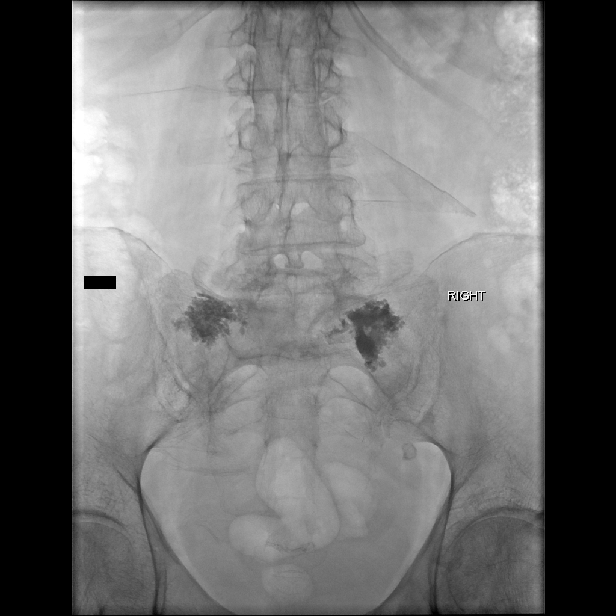
[im 42/42]
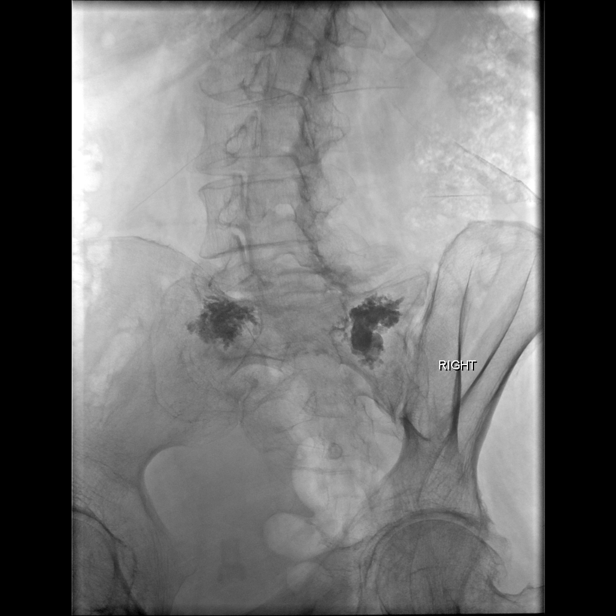

[14 of 24 positions shown; findings below may reference images not displayed]

EXAM:
SACROPLASTY BILATERAL APPROACH

MEDICATIONS:
As antibiotic prophylaxis, vancomycin 1 g IV was ordered
pre-procedure and administered intravenously within 1 hour of
incision.

ANESTHESIA/SEDATION:
Moderate (conscious) sedation was employed during this procedure. A
total of Versed 2 mg and Fentanyl 50 mcg was administered
intravenously.

Moderate Sedation Time: 32 minutes. The patient's level of
consciousness and vital signs were monitored continuously by
radiology nursing throughout the procedure under my direct
supervision.

FLUOROSCOPY TIME:  Fluoroscopy Time: 9 minutes 12 seconds (6228 mGy)

COMPLICATIONS:
None immediate.
PROCEDURE:
The patient was placed prone on the fluoroscopic table. Nasal oxygen
was administered. Physiologic monitoring was performed throughout
the duration of the procedure. The skin overlying the lumbosacral
region was prepped and draped in the usual sterile fashion. The S1
vertebral body was identified and bilateral skin entry sites at the
level of S1 was then infiltrated with 0.25% bupivacaine and extended
to the bone itself. This was then followed by the advancement of a
13-gauge Cook needle into the anterior one-third at S1. A gentle
contrast injection demonstrated a trabecular pattern of contrast
with opacification of pelvic veins on the left side. This
necessitated the use of Gelfoam pledgets into the 13 gauge Bm Shohag
spinal needle prior to the delivery of the methylmethacrylate
mixture.

At this time, methylmethacrylate mixture was reconstituted. Under
biplane intermittent fluoroscopy, the methylmethacrylate was then
injected into the S1 vertebral body with excellent filling of the
vertebral body.

No extravasation was noted into the disk spaces or posteriorly into
the spinal canal. No epidural or paraspinal venous contamination was
seen.

The needles were then removed. Hemostasis was achieved at the skin
entry sites.

There were no acute complications. Patient tolerated the procedure
well. The patient was observed for 3 hours and discharged in good
condition.
IMPRESSION: 1. Status post vertebral body augmentation for painful sacral
insufficiency fractures using bilateral sacroplasty technique.

## 2021-05-28 ENCOUNTER — Encounter: Payer: Self-pay | Admitting: Orthopaedic Surgery

## 2021-05-28 ENCOUNTER — Other Ambulatory Visit: Payer: Self-pay

## 2021-05-28 ENCOUNTER — Ambulatory Visit (INDEPENDENT_AMBULATORY_CARE_PROVIDER_SITE_OTHER): Payer: Medicare Other | Admitting: Orthopaedic Surgery

## 2021-05-28 ENCOUNTER — Ambulatory Visit (INDEPENDENT_AMBULATORY_CARE_PROVIDER_SITE_OTHER): Payer: Medicare Other

## 2021-05-28 DIAGNOSIS — M25559 Pain in unspecified hip: Secondary | ICD-10-CM | POA: Insufficient documentation

## 2021-05-28 DIAGNOSIS — M25551 Pain in right hip: Secondary | ICD-10-CM | POA: Diagnosis not present

## 2021-05-28 DIAGNOSIS — M1611 Unilateral primary osteoarthritis, right hip: Secondary | ICD-10-CM

## 2021-05-28 MED ORDER — METHYLPREDNISOLONE ACETATE 40 MG/ML IJ SUSP
80.0000 mg | INTRAMUSCULAR | Status: AC | PRN
  Administered 2021-05-28: 80 mg via INTRA_ARTICULAR

## 2021-05-28 MED ORDER — LIDOCAINE HCL 1 % IJ SOLN
2.0000 mL | INTRAMUSCULAR | Status: AC | PRN
  Administered 2021-05-28: 2 mL

## 2021-05-28 MED ORDER — BUPIVACAINE HCL 0.25 % IJ SOLN
2.0000 mL | INTRAMUSCULAR | Status: AC | PRN
  Administered 2021-05-28: 2 mL via INTRA_ARTICULAR

## 2021-05-28 NOTE — Progress Notes (Signed)
? ?Office Visit Note ?  ?Patient: Stacey Bryant           ?Date of Birth: [birth year]           ?MRN: [id number] ?Visit Date: 05/28/2021 ?             ?Requested by: Stacey Jan., MD ?[address] ?[place],  [place] [address] ?PCP: Stacey Jan., MD ? ? ?Assessment & Plan: ?Visit Diagnoses:  ?1. Pain in right hip   ?2. Unilateral primary osteoarthritis, right hip   ?3. Greater trochanteric pain syndrome   ? ? ?Plan: Stacey Bryant has been evaluated in the past for problems referable to her lumbar spine.  She had evidence of insufficiency sacral fractures and has had sacral plasty with excellent relief.  She is also had some injections in her back by Dr. Alvester Bryant that significantly made a difference.  In addition she has significant osteoarthritis of her right hip.  Dr. Alvester Bryant injected her hip several years ago and she notes it did not really make much of a difference.  Recently she has been having more trouble on the lateral aspect of her right hip in the area of the greater trochanter.  I suspect the pain may be referred from her hip but I will inject her greater trochanter because she is so tender in that 1 spot.  We will have her follow-up over the next 3 to 4 weeks if no improvement.  She might want to consider hip replacement ? ?Follow-Up Instructions: Return if symptoms worsen or fail to improve.  ? ?Orders:  ?Orders Placed This Encounter  ?Procedures  ? XR HIP UNILAT W OR W/O PELVIS 2-3 VIEWS RIGHT  ? ?No orders of the defined types were placed in this encounter. ? ? ? ? Procedures: ?Large Joint Inj: R greater trochanter on 05/28/2021 4:05 PM ?Indications: pain and diagnostic evaluation ?Details: 25 G 1.5 in needle ? ?Arthrogram: No ? ?Medications: 2 mL lidocaine 1 %; 80 mg methylPREDNISolone acetate 40 MG/ML; 2 mL bupivacaine 0.25 % ?Procedure, treatment alternatives, risks and benefits explained, specific risks discussed. Consent was given by the patient. Immediately prior to procedure a time out was called to  verify the correct patient, procedure, equipment, support staff and site/side marked as required. Patient was prepped and draped in the usual sterile fashion.  ? ? ? ? ?Clinical Data: ?No additional findings. ? ? ?Subjective: ?Chief Complaint  ?Patient presents with  ? Right Hip - Pain  ?Patient presents today for right hip pain. She said that she has been having lateral hip pain for 52months. No known injury.The pain radiates to her knee with any prolonged walking. No groin pain. No numbness or tingling in her legs. She has a history of sacroplasty. No lower back pain. She takes Tylenol as needed. ? ?HPI ? ?Review of Systems ? ? ?Objective: ?Vital Signs: There were no vitals taken for this visit. ? ?Physical Exam ?Constitutional:   ?   Appearance: She is well-developed.  ?Eyes:  ?   Pupils: Pupils are equal, round, and reactive to light.  ?Pulmonary:  ?   Effort: Pulmonary effort is normal.  ?Skin: ?   General: Skin is warm and dry.  ?Neurological:  ?   Mental Status: She is alert and oriented to person, place, and time.  ?Psychiatric:     ?   Behavior: Behavior normal.  ? ? ?Ortho Exam local tenderness directly over the greater trochanter right hip.  Skin intact.  Straight leg raising is negative.  There is significant discomfort with internal/external rotation of her right hip with groin pain and some referred pain to the greater trochanter. ? ?Specialty Comments:  ?No specialty comments available. ? ?Imaging: ?XR HIP UNILAT W OR W/O PELVIS 2-3 VIEWS RIGHT ? ?Result Date: 05/28/2021 ?AP pelvis demonstrates significant arthritis of the right hip with narrowing of the joint space, subchondral sclerosis of both sides of the joint and peripheral osteophytes.  Old films from 2019 of the hip were not available for review.  Films are consistent with advanced osteoarthritis  ? ? ?PMFS History: ?Patient Active Problem List  ? Diagnosis Date Noted  ? Greater trochanteric pain syndrome 05/28/2021  ? Low back pain 06/22/2019   ? Trochanteric bursitis, right hip 02/04/2018  ? 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 Pain in right hip 03/17/2017  ? Unilateral primary osteoarthritis, left knee 03/17/2017  ? Unilateral primary osteoarthritis, right hip 03/17/2017  ? ?Past Medical History:  ?Diagnosis Date  ? Arthritis   ? GERD (gastroesophageal reflux disease)   ? Headache   ? HX  MIGRAINES  ? Macular degeneration   ?  ?History reviewed. No pertinent family history.  ?Past Surgical History:  ?Procedure Laterality Date  ? CATARACT EXTRACTION, BILATERAL    ? IR SACROPLASTY BILATERAL  09/07/2019  ? JOINT REPLACEMENT    ? KNEE ARTHROSCOPY    ? BILATERAL   ? TOTAL KNEE ARTHROPLASTY Left 03/30/2017  ? TOTAL KNEE ARTHROPLASTY Left 03/30/2017  ? Procedure: LEFT TOTAL KNEE ARTHROPLASTY;  Surgeon: Valeria Batman, MD;  Location: [facility] OR;  Service: Orthopedics;  Laterality: Left;  ? ?Social History  ? ?Occupational History  ? Not on file  ?Tobacco Use  ? Smoking status: Never  ? Smokeless tobacco: Current  ?  Types: Chew  ?Vaping Use  ? Vaping Use: Never used  ?Substance and Sexual Activity  ? Alcohol use: Yes  ?  Alcohol/week: 1.0 standard drink  ?  Types: 1 Glasses of wine per week  ?  Comment: OCC  ? Drug use: No  ? Sexual activity: Not on file  ? ? ? ? ? ? ?

## 2021-06-19 ENCOUNTER — Encounter: Payer: Self-pay | Admitting: Orthopaedic Surgery

## 2021-06-19 DIAGNOSIS — M1611 Unilateral primary osteoarthritis, right hip: Secondary | ICD-10-CM

## 2021-06-23 NOTE — Telephone Encounter (Signed)
Referral entered  

## 2021-06-23 NOTE — Telephone Encounter (Signed)
I have spoken with patient. She did not know if, instead of a repeat troch injection, she could possibly do an injection in the hip joint. I advised that you did not do these, however, if you felt it may be beneficial, we could put an order in for her to see Dr. Alvester Morin. She would like to know if you think that the hip joint injection would help? Would you like for me to enter a referral for that? ?

## 2021-06-23 NOTE — Telephone Encounter (Signed)
Called Ms Egnor-please schedule intra articular right hip injection with Dr Selinda Eon

## 2021-07-23 ENCOUNTER — Encounter: Payer: Self-pay | Admitting: Physical Medicine and Rehabilitation

## 2021-07-23 ENCOUNTER — Ambulatory Visit: Payer: Self-pay

## 2021-07-23 ENCOUNTER — Ambulatory Visit (INDEPENDENT_AMBULATORY_CARE_PROVIDER_SITE_OTHER): Payer: Medicare Other | Admitting: Physical Medicine and Rehabilitation

## 2021-07-23 DIAGNOSIS — M25551 Pain in right hip: Secondary | ICD-10-CM

## 2021-07-23 NOTE — Progress Notes (Signed)
   Stacey Bryant - 76 y.o. female MRN [id number]  Date of birth: 09-29-1945  Office Visit Note: Visit Date: 07/23/2021 PCP: Raynelle Jan., MD Referred by: Raynelle Jan., MD  Subjective: Chief Complaint  Patient presents with   Right Hip - Pain   HPI:  Stacey Bryant is a 76 y.o. female who comes in today at the request of Dr. Norlene Campbell for planned Right anesthetic hip arthrogram with fluoroscopic guidance.  The patient has failed conservative care including home exercise, medications, time and activity modification.  This injection will be diagnostic and hopefully therapeutic.  Please see requesting physician notes for further details and justification.  ROS Otherwise per HPI.  Assessment & Plan: Visit Diagnoses:    ICD-10-CM   1. Pain in right hip  M25.551 Large Joint Inj: R hip joint    XR C-ARM NO REPORT      Plan: No additional findings.   Meds & Orders: No orders of the defined types were placed in this encounter.   Orders Placed This Encounter  Procedures   Large Joint Inj: R hip joint   XR C-ARM NO REPORT    Follow-up: Return if symptoms worsen or fail to improve.   Procedures: Large Joint Inj: R hip joint on 07/23/2021 1:16 PM Indications: diagnostic evaluation and pain Details: 22 G 3.5 in needle, fluoroscopy-guided anterior approach  Arthrogram: No  Medications: 4 mL bupivacaine 0.25 %; 60 mg triamcinolone acetonide 40 MG/ML Outcome: tolerated well, no immediate complications  There was excellent flow of contrast producing a partial arthrogram of the hip. The patient did have relief of symptoms during the anesthetic phase of the injection. Procedure, treatment alternatives, risks and benefits explained, specific risks discussed. Consent was given by the patient. Immediately prior to procedure a time out was called to verify the correct patient, procedure, equipment, support staff and site/side marked as required. Patient was prepped and draped in the usual  sterile fashion.         Clinical History: No specialty comments available.     Objective:  VS:  HT:    WT:   BMI:     BP:   HR: bpm  TEMP: ( )  RESP:  Physical Exam   Imaging: No results found.

## 2021-08-10 MED ORDER — BUPIVACAINE HCL 0.25 % IJ SOLN
4.0000 mL | INTRAMUSCULAR | Status: AC | PRN
  Administered 2021-07-23: 4 mL via INTRA_ARTICULAR

## 2021-08-10 MED ORDER — TRIAMCINOLONE ACETONIDE 40 MG/ML IJ SUSP
60.0000 mg | INTRAMUSCULAR | Status: AC | PRN
  Administered 2021-07-23: 60 mg via INTRA_ARTICULAR

## 2021-12-14 HISTORY — PX: CHOLECYSTECTOMY: SHX55

## 2022-01-27 ENCOUNTER — Telehealth: Payer: Self-pay | Admitting: Physical Medicine and Rehabilitation

## 2022-01-27 NOTE — Telephone Encounter (Signed)
Patient wants to set up injections with Dr Alvester Morin best number to reach  her [phone]

## 2022-02-23 ENCOUNTER — Other Ambulatory Visit: Payer: Self-pay | Admitting: Physical Medicine and Rehabilitation

## 2022-02-23 DIAGNOSIS — M25551 Pain in right hip: Secondary | ICD-10-CM

## 2022-02-23 NOTE — Telephone Encounter (Signed)
Pt calling back in... Pt is requesting to setup an appointment for injection... Pt requesting callback on cellphone.Stacey KitchenMarland Bryant

## 2022-02-23 NOTE — Telephone Encounter (Signed)
Spoke with patient and she stated she is having the same type of hip pain as before. She stated the last injection lasted a couple months. No new falls, injury or accidents. Please advise

## 2022-03-02 ENCOUNTER — Ambulatory Visit: Payer: Self-pay

## 2022-03-02 ENCOUNTER — Ambulatory Visit (INDEPENDENT_AMBULATORY_CARE_PROVIDER_SITE_OTHER): Payer: Medicare Other | Admitting: Physical Medicine and Rehabilitation

## 2022-03-02 DIAGNOSIS — M25551 Pain in right hip: Secondary | ICD-10-CM

## 2022-03-02 NOTE — Progress Notes (Signed)
Numeric Pain Rating Scale and Functional Assessment Average Pain 3   In the last MONTH (on 0-10 scale) has pain interfered with the following?  1. General activity like being  able to carry out your everyday physical activities such as walking, climbing stairs, carrying groceries, or moving a chair?  Rating(10)   +Driver, -BT, -Dye Allergies.  Right hip pain. Walking makes pain worse

## 2022-03-07 MED ORDER — BUPIVACAINE HCL 0.25 % IJ SOLN
4.0000 mL | INTRAMUSCULAR | Status: AC | PRN
  Administered 2022-03-02: 4 mL via INTRA_ARTICULAR

## 2022-03-07 MED ORDER — TRIAMCINOLONE ACETONIDE 40 MG/ML IJ SUSP
40.0000 mg | INTRAMUSCULAR | Status: AC | PRN
  Administered 2022-03-02: 40 mg via INTRA_ARTICULAR

## 2022-03-07 NOTE — Progress Notes (Signed)
   Stacey Bryant - 76 y.o. female MRN [id number]  Date of birth: 18-Jan-1946  Office Visit Note: Visit Date: 03/02/2022 PCP: Raynelle Jan., MD Referred by: Raynelle Jan., MD  Subjective: Chief Complaint  Patient presents with   Right Hip - Pain   HPI:  Stacey Bryant is a 76 y.o. female who comes in today for planned repeat Right anesthetic hip arthrogram with fluoroscopic guidance.  The patient has failed conservative care including home exercise, medications, time and activity modification. Prior injection gave more than 50% relief for several months. This injection will be diagnostic and hopefully therapeutic.  Please see requesting physician notes for further details and justification.  We will make referral today to Dr. Doneen Poisson for consideration of total hip arthroplasty.  She has been a patient of Dr. Cleophas Dunker who is retiring.  Referring: Dr. Norlene Campbell   ROS Otherwise per HPI.  Assessment & Plan: Visit Diagnoses:    ICD-10-CM   1. Pain in right hip  M25.551 XR C-ARM NO REPORT    Ambulatory referral to Orthopedic Surgery      Plan: No additional findings.   Meds & Orders: No orders of the defined types were placed in this encounter.   Orders Placed This Encounter  Procedures   Large Joint Inj   XR C-ARM NO REPORT   Ambulatory referral to Orthopedic Surgery    Follow-up: No follow-ups on file.   Procedures: Large Joint Inj: R hip joint on 03/02/2022 9:45 AM Indications: diagnostic evaluation and pain Details: 22 G 3.5 in needle, fluoroscopy-guided anterior approach  Arthrogram: No  Medications: 4 mL bupivacaine 0.25 %; 40 mg triamcinolone acetonide 40 MG/ML Outcome: tolerated well, no immediate complications  There was excellent flow of contrast producing a partial arthrogram of the hip. The patient did have relief of symptoms during the anesthetic phase of the injection. Procedure, treatment alternatives, risks and benefits explained, specific  risks discussed. Consent was given by the patient. Immediately prior to procedure a time out was called to verify the correct patient, procedure, equipment, support staff and site/side marked as required. Patient was prepped and draped in the usual sterile fashion.          Clinical History: No specialty comments available.     Objective:  VS:  HT:    WT:   BMI:     BP:   HR: bpm  TEMP: ( )  RESP:  Physical Exam   Imaging: No results found.

## 2022-03-25 ENCOUNTER — Ambulatory Visit (INDEPENDENT_AMBULATORY_CARE_PROVIDER_SITE_OTHER): Payer: Medicare Other | Admitting: Orthopaedic Surgery

## 2022-03-25 ENCOUNTER — Encounter: Payer: Self-pay | Admitting: Orthopaedic Surgery

## 2022-03-25 DIAGNOSIS — M1611 Unilateral primary osteoarthritis, right hip: Secondary | ICD-10-CM

## 2022-03-25 NOTE — Progress Notes (Signed)
The patient is a very pleasant 77 year old female sent to me from Dr. Durward Fortes to evaluate and treat her right hip arthritis and consider her for right hip replacement surgery.  She is incredibly active.  She does live alone.  She has had a left knee replacement before the did well.  Her right hip pain is daily and it is detrimentally affecting her mobility and her quality of life as well as her actives daily living.  She has had intra-articular injections of steroid in that hip as well.  She is not on any blood thinning medications and is not a diabetic.  She is very healthy.  Examination of her right hip shows significant stiffness with rotation.  There is also pain in the groin with internal and external rotation.  Her left hip exam is entirely normal.  An AP pelvis and lateral of the right hip from last year shows severe end-stage arthritis of the right hip.  There are periarticular osteophytes as well as joint space narrowing and sclerotic changes.  The left hip appears normal.  We had a long and thorough discussion about hip replacement surgery.  I went over her x-rays with her showing the arthritis of her right hip.  I gave her handout about hip replacement surgery and described in detail what the surgery involves in terms of risk and benefits and what to expect from an intraoperative and postoperative course.  All question concerns were answered and addressed.  She is going to work with her family in terms of determining who would need to be with her for few days when she has surgery.  She is considering this for potentially in the spring.  With that being said I gave her my surgery scheduler's card and she will let us know.  All question concerns were answered and addressed.

## 2022-06-04 ENCOUNTER — Other Ambulatory Visit: Payer: Self-pay | Admitting: Physician Assistant

## 2022-06-04 DIAGNOSIS — Z01818 Encounter for other preprocedural examination: Secondary | ICD-10-CM

## 2022-06-05 NOTE — Pre-Procedure Instructions (Signed)
Surgical Instructions    Your procedure is scheduled on June 16, 2022.  Report to [facility] Main Entrance "A" at 7:30 A.M., then check in with the Admitting office.  Call this number if you have problems the morning of surgery:  [phone]  If you have any questions prior to your surgery date call [phone]: Open Monday-Friday 8am-4pm If you experience any cold or flu symptoms such as cough, fever, chills, shortness of breath, etc. between now and your scheduled surgery, please notify us at the above number.     Remember:  Do not eat after midnight the night before your surgery  You may drink clear liquids until 6:30 AM the morning of your surgery.   Clear liquids allowed are: Water, Non-Citrus Juices (without pulp), Carbonated Beverages, Clear Tea, Black Coffee Only (NO MILK, CREAM OR POWDERED CREAMER of any kind), and Gatorade.  Patient Instructions  The night before surgery:  No food after midnight. ONLY clear liquids after midnight  The day of surgery (if you do NOT have diabetes):  Drink ONE (1) Pre-Surgery Clear Ensure by 6:30 AM the morning of surgery. Drink in one sitting. Do not sip.  This drink was given to you during your hospital  pre-op appointment visit.  Nothing else to drink after completing the  Pre-Surgery Clear Ensure.         If you have questions, please contact your surgeon's office.     Take these medicines the morning of surgery with A SIP OF WATER:  acetaminophen (TYLENOL) - may take if needed  carboxymethylcellulose (REFRESH PLUS) - may take if needed   As of today, STOP taking any Aspirin (unless otherwise instructed by your surgeon) Aleve, Naproxen, Ibuprofen, Motrin, Advil, Goody's, BC's, all herbal medications, fish oil, and all vitamins.                     Do NOT Smoke (Tobacco/Vaping) for 24 hours prior to your procedure.  If you use a CPAP at night, you may bring your mask/headgear for your overnight stay.   Contacts, glasses,  piercing's, hearing aid's, dentures or partials may not be worn into surgery, please bring cases for these belongings.    For patients admitted to the hospital, discharge time will be determined by your treatment team.   Patients discharged the day of surgery will not be allowed to drive home, and someone needs to stay with them for 24 hours.  SURGICAL WAITING ROOM VISITATION Patients having surgery or a procedure may have no more than 2 support people in the waiting area - these visitors may rotate.   Children under the age of 42 must have an adult with them who is not the patient. If the patient needs to stay at the hospital during part of their recovery, the visitor guidelines for inpatient rooms apply. Pre-op nurse will coordinate an appropriate time for 1 support person to accompany patient in pre-op.  This support person may not rotate.   Please refer to the [facility] website for the visitor guidelines for Inpatients (after your surgery is over and you are in a regular room).    Special instructions:   [place]- Preparing For Surgery  Before surgery, you can play an important role. Because skin is not sterile, your skin needs to be as free of germs as possible. You can reduce the number of germs on your skin by washing with CHG (chlorahexidine gluconate) Soap before surgery.  CHG is an antiseptic cleaner which  kills germs and bonds with the skin to continue killing germs even after washing.    Oral Hygiene is also important to reduce your risk of infection.  Remember - BRUSH YOUR TEETH THE MORNING OF SURGERY WITH YOUR REGULAR TOOTHPASTE  Please do not use if you have an allergy to CHG or antibacterial soaps. If your skin becomes reddened/irritated stop using the CHG.  Do not shave (including legs and underarms) for at least 48 hours prior to first CHG shower. It is OK to shave your face.  Please follow these instructions carefully.   Shower the NIGHT BEFORE SURGERY and the  MORNING OF SURGERY  If you chose to wash your hair, wash your hair first as usual with your normal shampoo.  After you shampoo, rinse your hair and body thoroughly to remove the shampoo.  Use CHG Soap as you would any other liquid soap. You can apply CHG directly to the skin and wash gently with a scrungie or a clean washcloth.   Apply the CHG Soap to your body ONLY FROM THE NECK DOWN.  Do not use on open wounds or open sores. Avoid contact with your eyes, ears, mouth and genitals (private parts). Wash Face and genitals (private parts)  with your normal soap.   Wash thoroughly, paying special attention to the area where your surgery will be performed.  Thoroughly rinse your body with warm water from the neck down.  DO NOT shower/wash with your normal soap after using and rinsing off the CHG Soap.  Pat yourself dry with a CLEAN TOWEL.  Wear CLEAN PAJAMAS to bed the night before surgery  Place CLEAN SHEETS on your bed the night before your surgery  DO NOT SLEEP WITH PETS.   Day of Surgery: Take a shower with CHG soap. Do not wear jewelry or makeup Do not wear lotions, powders, perfumes/colognes, or deodorant. Do not shave 48 hours prior to surgery.  Men may shave face and neck. Do not bring valuables to the hospital.  [facility] is not responsible for any belongings or valuables. Do not wear nail polish, gel polish, artificial nails, or any other type of covering on natural nails (fingers and toes) If you have artificial nails or gel coating that need to be removed by a nail salon, please have this removed prior to surgery. Artificial nails or gel coating may interfere with anesthesia's ability to adequately monitor your vital signs.  Wear Clean/Comfortable clothing the morning of surgery Remember to brush your teeth WITH YOUR REGULAR TOOTHPASTE.   Please read over the following fact sheets that you were given.    If you received a COVID test during your pre-op visit  it is  requested that you wear a mask when out in public, stay away from anyone that may not be feeling well and notify your surgeon if you develop symptoms. If you have been in contact with anyone that has tested positive in the last 10 days please notify you surgeon.

## 2022-06-08 ENCOUNTER — Other Ambulatory Visit: Payer: Self-pay

## 2022-06-08 ENCOUNTER — Encounter (HOSPITAL_COMMUNITY)
Admission: RE | Admit: 2022-06-08 | Discharge: 2022-06-08 | Disposition: A | Discharge status: Home / Self Care | Payer: Medicare Other | Source: Ambulatory Visit | Attending: Orthopaedic Surgery | Admitting: Orthopaedic Surgery

## 2022-06-08 ENCOUNTER — Encounter (HOSPITAL_COMMUNITY): Payer: Self-pay

## 2022-06-08 VITALS — BP 123/65 | HR 88 | Temp 98.0°F | Resp 18 | Ht 65.0 in | Wt 169.1 lb

## 2022-06-08 DIAGNOSIS — Z01818 Encounter for other preprocedural examination: Secondary | ICD-10-CM

## 2022-06-08 DIAGNOSIS — Z01812 Encounter for preprocedural laboratory examination: Secondary | ICD-10-CM | POA: Insufficient documentation

## 2022-06-08 LAB — TYPE AND SCREEN
ABO/RH(D): A POS
Antibody Screen: NEGATIVE

## 2022-06-08 LAB — COMPREHENSIVE METABOLIC PANEL
ALT: 18 U/L (ref 0–44)
AST: 22 U/L (ref 15–41)
Albumin: 3.7 g/dL (ref 3.5–5.0)
Alkaline Phosphatase: 65 U/L (ref 38–126)
Anion gap: 8 (ref 5–15)
BUN: 11 mg/dL (ref 8–23)
CO2: 28 mmol/L (ref 22–32)
Calcium: 9.3 mg/dL (ref 8.9–10.3)
Chloride: 103 mmol/L (ref 98–111)
Creatinine, Ser: 0.72 mg/dL (ref 0.44–1.00)
GFR, Estimated: >60 mL/min (ref >60)
Glucose, Bld: 126 mg/dL — ABNORMAL HIGH (ref 70–99)
Potassium: 3.7 mmol/L (ref 3.5–5.1)
Sodium: 139 mmol/L (ref 135–145)
Total Bilirubin: 0.7 mg/dL (ref 0.3–1.2)
Total Protein: 7 g/dL (ref 6.5–8.1)

## 2022-06-08 LAB — CBC
HCT: 40.5 % (ref 36.0–46.0)
Hemoglobin: 12.8 g/dL (ref 12.0–15.0)
MCH: 27.5 pg (ref 26.0–34.0)
MCHC: 31.6 g/dL (ref 30.0–36.0)
MCV: 87.1 fL (ref 80.0–100.0)
Platelets: 239 10*3/uL (ref 150–400)
RBC: 4.65 MIL/uL (ref 3.87–5.11)
RDW: 13.8 % (ref 11.5–15.5)
WBC: 6.7 10*3/uL (ref 4.0–10.5)
nRBC: 0 % (ref 0.0–0.2)

## 2022-06-08 LAB — SURGICAL PCR SCREEN
MRSA, PCR: NEGATIVE
Staphylococcus aureus: POSITIVE — AB

## 2022-06-08 NOTE — Progress Notes (Signed)
PCP - Dr. Ward Givens Cardiologist - Denies  PPM/ICD - Denies Device Orders - n/a Rep Notified - n/a  Chest x-ray - n/a EKG - Many years ago as pre operative work-up. Result normal.  Stress Test - Denies ECHO - Denies Cardiac Cath - Denies  Sleep Study - Denies CPAP - n/a  No DM  Last dose of GLP1 agonist- n/a  GLP1 instructions: n/a  Blood Thinner Instructions: n/a Aspirin Instructions: n/a  ERAS Protcol - Clear liquids until 0630 morning of surgery PRE-SURGERY Ensure or G2- Ensure given to pt with instructions  COVID TEST- n/a   Anesthesia review: No.   Patient denies shortness of breath, fever, cough and chest pain at PAT appointment. Pt does endorse watery eyes and runny nose related to seasonal allergies. Pt instructed to notify the hospital if she were to develop any additional symptoms such as cough, fever, or sore throat. Pt understood instructions.   All instructions explained to the patient, with a verbal understanding of the material. Patient agrees to go over the instructions while at home for a better understanding. Patient also instructed to self quarantine after being tested for COVID-19. The opportunity to ask questions was provided.

## 2022-06-08 NOTE — Progress Notes (Signed)
It was agreed upon by the Anesthesia team and Dr. Ninfa Linden that pt could continue taking her ARED eye supplement (for her Macular Degeneration) until two days prior to surgery. Pt will not take any the day before surgery or any the morning of surgery.  This RN called and notified patient of instructions and pt understood.

## 2022-06-15 NOTE — H&P (Signed)
TOTAL HIP ADMISSION H&P  Patient is admitted for right total hip arthroplasty.  Subjective:  Chief Complaint: right hip pain  HPI: Stacey Bryant, 77 y.o. female, has a history of pain and functional disability in the right hip(s) due to arthritis and patient has failed non-surgical conservative treatments for greater than 12 weeks to include NSAID's and/or analgesics, corticosteriod injections, flexibility and strengthening excercises, and activity modification.  Onset of symptoms was gradual starting 3 years ago with gradually worsening course since that time.The patient noted no past surgery on the right hip(s).  Patient currently rates pain in the right hip at 10 out of 10 with activity. Patient has night pain, worsening of pain with activity and weight bearing, pain that interfers with activities of daily living, and pain with passive range of motion. Patient has evidence of subchondral cysts, subchondral sclerosis, periarticular osteophytes, and joint space narrowing by imaging studies. This condition presents safety issues increasing the risk of falls.  There is no current active infection.  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   Pain in right hip 03/17/2017   Unilateral primary osteoarthritis, right hip 03/17/2017   Past Medical History:  Diagnosis Date   Arthritis    Headache    Hx migraines with periods   Macular degeneration     Past Surgical History:  Procedure Laterality Date   CATARACT EXTRACTION, BILATERAL     CHOLECYSTECTOMY  12/2021   [facility]   COLONOSCOPY     IR SACROPLASTY BILATERAL  09/07/2019   JOINT REPLACEMENT     KNEE ARTHROSCOPY     BILATERAL    TOTAL KNEE ARTHROPLASTY Left 03/30/2017   TOTAL KNEE ARTHROPLASTY Left 03/30/2017   Procedure: LEFT TOTAL KNEE  ARTHROPLASTY;  Surgeon: Garald Balding, MD;  Location: [facility];  Service: Orthopedics;  Laterality: Left;   TUBAL LIGATION  1983    No current facility-administered medications for this encounter.   Current Outpatient Medications  Medication Sig Dispense Refill Last Dose   acetaminophen (TYLENOL) 650 MG CR tablet Take 1,300 mg by mouth every 8 (eight) hours as needed for pain.      carboxymethylcellulose (REFRESH PLUS) 0.5 % SOLN Place 1 drop into both eyes daily as needed (dry eyes).      cholecalciferol (VITAMIN D) 1000 units tablet Take 1,000 Units by mouth daily.      Multiple Vitamins-Minerals (PRESERVISION AREDS 2+MULTI VIT PO) Take 1 capsule by mouth in the morning and at bedtime.      triamcinolone cream (KENALOG) 0.1 % Apply 1 Application topically daily as needed (eczema).      Allergies  Allergen Reactions   Alendronate Other (See Comments)   Penicillin G Rash    Has patient had a PCN reaction causing immediate rash, facial/tongue/throat swelling, SOB or lightheadedness with hypotension: No Has patient had a PCN reaction causing severe rash involving mucus membranes or skin necrosis: No Has patient had a PCN reaction that required hospitalization: No Has patient had a PCN reaction occurring within the last 10 years: No If all of the above answers are "NO", then may proceed with Cephalosporin use.     Social History   Tobacco Use   Smoking status: Never   Smokeless tobacco: Never  Substance Use Topics   Alcohol use: Yes  Alcohol/week: 1.0 standard drink of alcohol    Types: 1 Glasses of wine per week    Comment: OCC    No family history on file.   Review of Systems  Objective:  Physical Exam Vitals reviewed.  Constitutional:      Appearance: Normal appearance. She is normal weight.  HENT:     Head: Normocephalic and atraumatic.  Eyes:     Extraocular Movements: Extraocular movements intact.     Pupils: Pupils are equal, round, and reactive to light.   Cardiovascular:     Rate and Rhythm: Normal rate.  Pulmonary:     Effort: Pulmonary effort is normal.     Breath sounds: Normal breath sounds.  Abdominal:     Palpations: Abdomen is soft.  Musculoskeletal:     Cervical back: Normal range of motion and neck supple.     Right hip: Tenderness and bony tenderness present. Decreased range of motion. Decreased strength.  Neurological:     Mental Status: She is alert and oriented to person, place, and time.  Psychiatric:        Behavior: Behavior normal.     Vital signs in last 24 hours:    Labs:   Estimated body mass index is 28.14 kg/m as calculated from the following:   Height as of 06/08/22: 5\' 5"  (1.651 m).   Weight as of 06/08/22: 76.7 kg.   Imaging Review Plain radiographs demonstrate severe degenerative joint disease of the right hip(s). The bone quality appears to be good for age and reported activity level.      Assessment/Plan:  End stage arthritis, right hip(s)  The patient history, physical examination, clinical judgement of the provider and imaging studies are consistent with end stage degenerative joint disease of the right hip(s) and total hip arthroplasty is deemed medically necessary. The treatment options including medical management, injection therapy, arthroscopy and arthroplasty were discussed at length. The risks and benefits of total hip arthroplasty were presented and reviewed. The risks due to aseptic loosening, infection, stiffness, dislocation/subluxation,  thromboembolic complications and other imponderables were discussed.  The patient acknowledged the explanation, agreed to proceed with the plan and consent was signed. Patient is being admitted for inpatient treatment for surgery, pain control, PT, OT, prophylactic antibiotics, VTE prophylaxis, progressive ambulation and ADL's and discharge planning.The patient is planning to be discharged home with home health services

## 2022-06-16 ENCOUNTER — Encounter (HOSPITAL_COMMUNITY): Payer: Self-pay | Admitting: Orthopaedic Surgery

## 2022-06-16 ENCOUNTER — Ambulatory Visit (HOSPITAL_BASED_OUTPATIENT_CLINIC_OR_DEPARTMENT_OTHER): Payer: Medicare Other | Admitting: Certified Registered Nurse Anesthetist

## 2022-06-16 ENCOUNTER — Encounter (HOSPITAL_COMMUNITY)
Admission: RE | Disposition: A | Discharge status: Home Health | Payer: Self-pay | Source: Ambulatory Visit | Attending: Orthopaedic Surgery

## 2022-06-16 ENCOUNTER — Other Ambulatory Visit: Payer: Self-pay

## 2022-06-16 ENCOUNTER — Observation Stay (HOSPITAL_COMMUNITY): Payer: Medicare Other

## 2022-06-16 ENCOUNTER — Observation Stay (HOSPITAL_COMMUNITY)
Admission: RE | Admit: 2022-06-16 | Discharge: 2022-06-17 | Disposition: A | Discharge status: Home Health | Payer: Medicare Other | Source: Ambulatory Visit | Attending: Orthopaedic Surgery | Admitting: Orthopaedic Surgery

## 2022-06-16 ENCOUNTER — Ambulatory Visit (HOSPITAL_COMMUNITY): Payer: Medicare Other | Admitting: Vascular Surgery

## 2022-06-16 ENCOUNTER — Ambulatory Visit (HOSPITAL_COMMUNITY): Payer: Medicare Other

## 2022-06-16 DIAGNOSIS — Z96641 Presence of right artificial hip joint: Secondary | ICD-10-CM

## 2022-06-16 DIAGNOSIS — M1611 Unilateral primary osteoarthritis, right hip: Principal | ICD-10-CM | POA: Diagnosis present

## 2022-06-16 DIAGNOSIS — Z96652 Presence of left artificial knee joint: Secondary | ICD-10-CM | POA: Diagnosis not present

## 2022-06-16 DIAGNOSIS — Z79899 Other long term (current) drug therapy: Secondary | ICD-10-CM | POA: Diagnosis not present

## 2022-06-16 HISTORY — PX: TOTAL HIP ARTHROPLASTY: SHX124

## 2022-06-16 SURGERY — ARTHROPLASTY, HIP, TOTAL, ANTERIOR APPROACH
Anesthesia: Monitor Anesthesia Care | Site: Hip | Laterality: Right

## 2022-06-16 MED ORDER — PANTOPRAZOLE SODIUM 40 MG PO TBEC
40.0000 mg | DELAYED_RELEASE_TABLET | Freq: Every day | ORAL | Status: DC
  Administered 2022-06-16 – 2022-06-17 (×2): 40 mg via ORAL
  Filled 2022-06-16 (×2): qty 1

## 2022-06-16 MED ORDER — 0.9 % SODIUM CHLORIDE (POUR BTL) OPTIME
TOPICAL | Status: DC | PRN
  Administered 2022-06-16: 1000 mL

## 2022-06-16 MED ORDER — SODIUM CHLORIDE 0.9 % IR SOLN
Status: DC | PRN
  Administered 2022-06-16: 1000 mL

## 2022-06-16 MED ORDER — ONDANSETRON HCL 4 MG/2ML IJ SOLN: 4.0000 mg | Freq: Four times a day (QID) | INTRAMUSCULAR | Status: DC | PRN

## 2022-06-16 MED ORDER — DIPHENHYDRAMINE HCL 12.5 MG/5ML PO ELIX: 12.5000 mg | ORAL_SOLUTION | ORAL | Status: DC | PRN

## 2022-06-16 MED ORDER — DOCUSATE SODIUM 100 MG PO CAPS
100.0000 mg | ORAL_CAPSULE | Freq: Two times a day (BID) | ORAL | Status: DC
  Administered 2022-06-16 – 2022-06-17 (×3): 100 mg via ORAL
  Filled 2022-06-16 (×3): qty 1

## 2022-06-16 MED ORDER — METHOCARBAMOL 500 MG PO TABS
500.0000 mg | ORAL_TABLET | Freq: Four times a day (QID) | ORAL | Status: DC | PRN
  Administered 2022-06-16: 500 mg via ORAL
  Filled 2022-06-16: qty 1

## 2022-06-16 MED ORDER — BUPIVACAINE IN DEXTROSE 0.75-8.25 % IT SOLN
INTRATHECAL | Status: DC | PRN
  Administered 2022-06-16: 1.6 mL via INTRATHECAL

## 2022-06-16 MED ORDER — VITAMIN D 25 MCG (1000 UNIT) PO TABS
1000.0000 [IU] | ORAL_TABLET | Freq: Every day | ORAL | Status: DC
  Administered 2022-06-16 – 2022-06-17 (×2): 1000 [IU] via ORAL
  Filled 2022-06-16 (×2): qty 1

## 2022-06-16 MED ORDER — METHOCARBAMOL 1000 MG/10ML IJ SOLN: 500.0000 mg | Freq: Four times a day (QID) | INTRAVENOUS | Status: DC | PRN

## 2022-06-16 MED ORDER — ACETAMINOPHEN 325 MG PO TABS
325.0000 mg | ORAL_TABLET | Freq: Four times a day (QID) | ORAL | Status: DC | PRN
  Administered 2022-06-17: 650 mg via ORAL
  Filled 2022-06-16: qty 2

## 2022-06-16 MED ORDER — ORAL CARE MOUTH RINSE: 15.0000 mL | Freq: Once | OROMUCOSAL | Status: AC

## 2022-06-16 MED ORDER — ACETAMINOPHEN 10 MG/ML IV SOLN: 1000.0000 mg | Freq: Once | INTRAVENOUS | Status: DC | PRN

## 2022-06-16 MED ORDER — CEFAZOLIN SODIUM-DEXTROSE 1-4 GM/50ML-% IV SOLN
1.0000 g | Freq: Four times a day (QID) | INTRAVENOUS | Status: AC
  Administered 2022-06-16 (×2): 1 g via INTRAVENOUS
  Filled 2022-06-16 (×2): qty 50

## 2022-06-16 MED ORDER — METOCLOPRAMIDE HCL 5 MG/ML IJ SOLN: 5.0000 mg | Freq: Three times a day (TID) | INTRAMUSCULAR | Status: DC | PRN

## 2022-06-16 MED ORDER — PHENOL 1.4 % MT LIQD: 1.0000 | OROMUCOSAL | Status: DC | PRN

## 2022-06-16 MED ORDER — LACTATED RINGERS IV SOLN: INTRAVENOUS | Status: DC

## 2022-06-16 MED ORDER — ALUM & MAG HYDROXIDE-SIMETH 200-200-20 MG/5ML PO SUSP: 30.0000 mL | ORAL | Status: DC | PRN

## 2022-06-16 MED ORDER — MENTHOL 3 MG MT LOZG: 1.0000 | LOZENGE | OROMUCOSAL | Status: DC | PRN

## 2022-06-16 MED ORDER — FENTANYL CITRATE (PF) 100 MCG/2ML IJ SOLN: 25.0000 ug | INTRAMUSCULAR | Status: DC | PRN

## 2022-06-16 MED ORDER — OXYCODONE HCL 5 MG PO TABS
10.0000 mg | ORAL_TABLET | ORAL | Status: DC | PRN
  Administered 2022-06-16 – 2022-06-17 (×2): 10 mg via ORAL
  Filled 2022-06-16: qty 3
  Filled 2022-06-16: qty 2

## 2022-06-16 MED ORDER — METOCLOPRAMIDE HCL 5 MG PO TABS: 5.0000 mg | ORAL_TABLET | Freq: Three times a day (TID) | ORAL | Status: DC | PRN

## 2022-06-16 MED ORDER — HYDROMORPHONE HCL 1 MG/ML IJ SOLN
0.5000 mg | INTRAMUSCULAR | Status: DC | PRN
  Administered 2022-06-16: 1 mg via INTRAVENOUS
  Filled 2022-06-16: qty 1

## 2022-06-16 MED ORDER — ONDANSETRON HCL 4 MG PO TABS: 4.0000 mg | ORAL_TABLET | Freq: Four times a day (QID) | ORAL | Status: DC | PRN

## 2022-06-16 MED ORDER — SODIUM CHLORIDE 0.9 % IV SOLN: INTRAVENOUS | Status: DC

## 2022-06-16 MED ORDER — PHENYLEPHRINE HCL-NACL 20-0.9 MG/250ML-% IV SOLN
INTRAVENOUS | Status: DC | PRN
  Administered 2022-06-16: 50 ug/min via INTRAVENOUS

## 2022-06-16 MED ORDER — OXYCODONE HCL 5 MG PO TABS
5.0000 mg | ORAL_TABLET | ORAL | Status: DC | PRN
  Administered 2022-06-16 – 2022-06-17 (×2): 5 mg via ORAL
  Administered 2022-06-17: 10 mg via ORAL
  Filled 2022-06-16 (×2): qty 1
  Filled 2022-06-16: qty 2

## 2022-06-16 MED ORDER — POVIDONE-IODINE 10 % EX SWAB
2.0000 | Freq: Once | CUTANEOUS | Status: AC
  Administered 2022-06-16: 2 via TOPICAL

## 2022-06-16 MED ORDER — PROPOFOL 1000 MG/100ML IV EMUL
INTRAVENOUS | Status: AC
  Filled 2022-06-16: qty 100

## 2022-06-16 MED ORDER — ASPIRIN 81 MG PO CHEW
81.0000 mg | CHEWABLE_TABLET | Freq: Two times a day (BID) | ORAL | Status: DC
  Administered 2022-06-16 – 2022-06-17 (×2): 81 mg via ORAL
  Filled 2022-06-16 (×2): qty 1

## 2022-06-16 MED ORDER — TRANEXAMIC ACID-NACL 1000-0.7 MG/100ML-% IV SOLN
1000.0000 mg | INTRAVENOUS | Status: AC
  Administered 2022-06-16: 1000 mg via INTRAVENOUS
  Filled 2022-06-16: qty 100

## 2022-06-16 MED ORDER — CEFAZOLIN SODIUM-DEXTROSE 2-4 GM/100ML-% IV SOLN
2.0000 g | INTRAVENOUS | Status: AC
  Administered 2022-06-16: 2 g via INTRAVENOUS
  Filled 2022-06-16: qty 100

## 2022-06-16 MED ORDER — CHLORHEXIDINE GLUCONATE 0.12 % MT SOLN
15.0000 mL | Freq: Once | OROMUCOSAL | Status: AC
  Administered 2022-06-16: 15 mL via OROMUCOSAL
  Filled 2022-06-16: qty 15

## 2022-06-16 MED ORDER — PROPOFOL 500 MG/50ML IV EMUL
INTRAVENOUS | Status: DC | PRN
  Administered 2022-06-16: 100 ug/kg/min via INTRAVENOUS

## 2022-06-16 SURGICAL SUPPLY — 55 items
APL SKNCLS STERI-STRIP NONHPOA (GAUZE/BANDAGES/DRESSINGS)
BAG COUNTER SPONGE SURGICOUNT (BAG) ×1 IMPLANT
BAG SPNG CNTER NS LX DISP (BAG) ×1
BENZOIN TINCTURE PRP APPL 2/3 (GAUZE/BANDAGES/DRESSINGS) ×1 IMPLANT
BLADE CLIPPER SURG (BLADE) IMPLANT
BLADE SAW SGTL 18X1.27X75 (BLADE) ×1 IMPLANT
COVER SURGICAL LIGHT HANDLE (MISCELLANEOUS) ×1 IMPLANT
DRAPE C-ARM 42X72 X-RAY (DRAPES) ×1 IMPLANT
DRAPE STERI IOBAN 125X83 (DRAPES) ×1 IMPLANT
DRAPE U-SHAPE 47X51 STRL (DRAPES) ×3 IMPLANT
DRSG AQUACEL AG ADV 3.5X10 (GAUZE/BANDAGES/DRESSINGS) ×1 IMPLANT
DURAPREP 26ML APPLICATOR (WOUND CARE) ×1 IMPLANT
ELECT BLADE 4.0 EZ CLEAN MEGAD (MISCELLANEOUS) ×1
ELECT BLADE 6.5 EXT (BLADE) IMPLANT
ELECT REM PT RETURN 9FT ADLT (ELECTROSURGICAL) ×1
ELECTRODE BLDE 4.0 EZ CLN MEGD (MISCELLANEOUS) ×1 IMPLANT
ELECTRODE REM PT RTRN 9FT ADLT (ELECTROSURGICAL) ×1 IMPLANT
FACESHIELD WRAPAROUND (MASK) ×2 IMPLANT
FACESHIELD WRAPAROUND OR TEAM (MASK) ×2 IMPLANT
GLOVE BIOGEL PI IND STRL 8 (GLOVE) ×2 IMPLANT
GLOVE ECLIPSE 8.0 STRL XLNG CF (GLOVE) ×1 IMPLANT
GLOVE ORTHO TXT STRL SZ7.5 (GLOVE) ×2 IMPLANT
GOWN STRL REUS W/ TWL LRG LVL3 (GOWN DISPOSABLE) ×2 IMPLANT
GOWN STRL REUS W/ TWL XL LVL3 (GOWN DISPOSABLE) ×2 IMPLANT
GOWN STRL REUS W/TWL LRG LVL3 (GOWN DISPOSABLE) ×2
GOWN STRL REUS W/TWL XL LVL3 (GOWN DISPOSABLE) ×2
HANDPIECE INTERPULSE COAX TIP (DISPOSABLE) ×1
HEAD M SROM 36MM PLUS 1.5 (Hips) IMPLANT
KIT BASIN OR (CUSTOM PROCEDURE TRAY) ×1 IMPLANT
KIT TURNOVER KIT B (KITS) ×1 IMPLANT
LINER ACETAB NEUTRAL 36ID 520D (Liner) IMPLANT
MANIFOLD NEPTUNE II (INSTRUMENTS) ×1 IMPLANT
NS IRRIG 1000ML POUR BTL (IV SOLUTION) ×1 IMPLANT
PACK TOTAL JOINT (CUSTOM PROCEDURE TRAY) ×1 IMPLANT
PAD ARMBOARD 7.5X6 YLW CONV (MISCELLANEOUS) ×1 IMPLANT
PIN SECTOR W/GRIP ACE CUP 52MM (Hips) IMPLANT
SET HNDPC FAN SPRY TIP SCT (DISPOSABLE) ×1 IMPLANT
SROM M HEAD 36MM PLUS 1.5 (Hips) ×1 IMPLANT
STAPLER VISISTAT 35W (STAPLE) IMPLANT
STEM FEMORAL SZ 6MM STD ACTIS (Stem) IMPLANT
STRIP CLOSURE SKIN 1/2X4 (GAUZE/BANDAGES/DRESSINGS) ×2 IMPLANT
SUT ETHIBOND NAB CT1 #1 30IN (SUTURE) ×1 IMPLANT
SUT MNCRL AB 4-0 PS2 18 (SUTURE) IMPLANT
SUT VIC AB 0 CT1 27 (SUTURE) ×2
SUT VIC AB 0 CT1 27XBRD ANBCTR (SUTURE) ×1 IMPLANT
SUT VIC AB 1 CT1 27 (SUTURE) ×2
SUT VIC AB 1 CT1 27XBRD ANBCTR (SUTURE) ×1 IMPLANT
SUT VIC AB 2-0 CT1 27 (SUTURE) ×2
SUT VIC AB 2-0 CT1 TAPERPNT 27 (SUTURE) ×1 IMPLANT
TOWEL GREEN STERILE (TOWEL DISPOSABLE) ×1 IMPLANT
TOWEL GREEN STERILE FF (TOWEL DISPOSABLE) ×1 IMPLANT
TRAY CATH INTERMITTENT SS 16FR (CATHETERS) IMPLANT
TRAY FOLEY W/BAG SLVR 16FR (SET/KITS/TRAYS/PACK) ×1
TRAY FOLEY W/BAG SLVR 16FR ST (SET/KITS/TRAYS/PACK) IMPLANT
WATER STERILE IRR 1000ML POUR (IV SOLUTION) ×2 IMPLANT

## 2022-06-16 NOTE — Anesthesia Preprocedure Evaluation (Signed)
Anesthesia Evaluation  Patient identified by MRN, date of birth, ID band Patient awake    Reviewed: Allergy & Precautions, NPO status , Patient's Chart, lab work & pertinent test results  Airway Mallampati: II  TM Distance: >3 FB Neck ROM: Full    Dental no notable dental hx.    Pulmonary neg pulmonary ROS   Pulmonary exam normal        Cardiovascular negative cardio ROS  Rhythm:Regular Rate:Normal     Neuro/Psych  Headaches  negative psych ROS   GI/Hepatic negative GI ROS, Neg liver ROS,,,  Endo/Other  negative endocrine ROS    Renal/GU negative Renal ROS  negative genitourinary   Musculoskeletal  (+) Arthritis , Osteoarthritis,    Abdominal Normal abdominal exam  (+)   Peds  Hematology negative hematology ROS (+)   Anesthesia Other Findings   Reproductive/Obstetrics                             Anesthesia Physical Anesthesia Plan  ASA: 2  Anesthesia Plan: MAC and Spinal   Post-op Pain Management:    Induction: Intravenous  PONV Risk Score and Plan: 2 and Ondansetron, Dexamethasone, Propofol infusion and Treatment may vary due to age or medical condition  Airway Management Planned: Simple Face Mask and Nasal Cannula  Additional Equipment: None  Intra-op Plan:   Post-operative Plan:   Informed Consent: I have reviewed the patients History and Physical, chart, labs and discussed the procedure including the risks, benefits and alternatives for the proposed anesthesia with the patient or authorized representative who has indicated his/her understanding and acceptance.     Dental advisory given  Plan Discussed with: CRNA  Anesthesia Plan Comments:        Anesthesia Quick Evaluation

## 2022-06-16 NOTE — Interval H&P Note (Signed)
History and Physical Interval Note: The patient understands that she is here today for right total hip replacement to treat her severe right hip arthritis.  There has been no acute or interval change in her medical status.  See H&P.  The risks and benefits of surgery been explained in detail and informed consent is obtained.  The right operative hip has been marked.  06/16/2022 9:12 AM  Ernestine Conrad  has presented today for surgery, with the diagnosis of right hip osteoarthritis.  The various methods of treatment have been discussed with the patient and family. After consideration of risks, benefits and other options for treatment, the patient has consented to  Procedure(s): RIGHT TOTAL HIP ARTHROPLASTY ANTERIOR APPROACH (Right) as a surgical intervention.  The patient's history has been reviewed, patient examined, no change in status, stable for surgery.  I have reviewed the patient's chart and labs.  Questions were answered to the patient's satisfaction.     Mcarthur Rossetti

## 2022-06-16 NOTE — Op Note (Signed)
Operative Note  Date of operation: 06/16/2022 Preoperative diagnosis: Right hip primary osteoarthritis Postoperative diagnosis: Same  Procedure: Right direct anterior total hip arthroplasty  Implants: Implant Name Type Inv. Item Serial No. Manufacturer Lot No. LRB No. Used Action  PIN SECTOR W/GRIP ACE CUP 52MM - [id number] Hips PIN SECTOR W/GRIP ACE CUP 52MM  [employer] [id number] Right 1 Implanted  LINER ACETAB NEUTRAL 36ID 520D - [id number] Liner LINER ACETAB NEUTRAL 36ID 520D  [employer] [id number] Right 1 Implanted  STEM FEMORAL SZ 6MM STD ACTIS - [id number] Stem STEM FEMORAL SZ 6MM STD ACTIS  [employer]PU[employer]TH[employer] [id number] Right 1 Implanted  SROM M HEAD 36MM PLUS 1.5 - [id number] Hips SROM M HEAD 36MM PLUS 1.5  [employer] [id number] Right 1 Implanted   Surgeon: Lind Guest. Ninfa Linden, MD Assistant: Benita Stabile, PA-C  Anesthesia: Spinal Antibiotics: 2 g IV Ancef EBL: [id number] cc Complications: None  Indications: The patient is a 77 year old female well-known to me.  She has well-documented severe and stage arthritis involving her right hip.  At this point she has tried and failed conservative treatment for over a year.  Her right hip pain is debilitating.  It is detrimentally affecting her mobility, her quality of life, and her actives daily living.  Her x-rays also show severe arthritis of the right hip.  We have recommended total hip arthroplasty surgery.  We discussed the risk of acute blood loss anemia, nerve vessel injury, fracture, infection, DVT, implant failure, leg length differences, dislocation, and wound healing issues.  We also talked about the goals being hopefully decrease pain, improve range of motion, improve quality of life and mobility.  Procedure description: After informed consent was obtained and the appropriate right hip was marked, the patient was brought to the operating room and set up on the stretcher where spinal anesthesia was obtained.  She was  then laid in supine position on the stretcher.  A Foley catheter was placed and traction boots were placed on both her feet.  Next she was placed supine on the Hana fracture table with a perineal post in place in both legs and inline skeletal traction devices but no traction applied.  Her right operative hip was prepped and draped with DuraPrep and sterile drapes.  A timeout was called and she was identified as the correct patient and the correct right hip.  An incision was then made just inferior and posterior to the ASIS and carried slightly obliquely down the leg.  Dissection was taken down to the tensor fascia lata muscle and the tensor fascia was then divided longitudinally to proceed with a direct anterior approach to the hip.  Circumflex vessels were identified and cauterized and hip capsule identified and opened up in L-type format.  Cobra retractors were placed around the medial and lateral femoral neck and a femoral neck cut was made with an oscillating saw just proximal to the lesser trochanter and this was completed with an osteotome.  A corkscrew guide was placed in the femoral head and the femoral head was removed in its entirety.  It was a wide area of devoid of cartilage.  We then placed a bent Hohmann over the medial acetabular rim and removed remnants of the acetabular labrum and other debris.  We then began reaming and direct visitation and direct fluoroscopy from a size 43 reamer and stepwise increments going up to a size 52 reamer with all reamers placed again under visualization and the last reamer was placed under direct fluoroscopy in order  to obtain our depth of reaming, our inclination and the anteversion.  The real DePuy sector GRIPTION acetabular component size 52 was then placed without difficulty followed by 36+0 polythene liner.  Attention was then turned to the femur.  With the right leg externally rotated to 120 degrees, extended and adducted, a Mueller retractor was placed medially  and a Hohmann retractor was placed behind the greater trochanter.  The lateral joint capsule was released and a box cutting osteotome was used into the femoral canal.  Broaching was then initiated using the Actis broaching system from DePuy from a size 0 broach and stepwise increments going to a size 6 broach.  With a size 6 broach in place we trialed a standard offset femoral neck and a 36+1.5 trial hip ball.  This was reduced in the pelvis and we are pleased with leg length, offset, range of motion and stability again assessed mechanically and radiographically.  The hip was then dislocated and remove the trial components.  We placed the real Actis femoral component size 6 with standard offset and the real 36+1.5 metal hip ball.  Again this reduced in the acetabulum we assessed mechanically and radiographically and were pleased.  The soft tissue was then irrigated normal saline solution.  Remnants of the joint capsule were closed with interrupted #1 Ethibond suture followed by #1 Vicryl to close the tensor fascia.  0 Vicryl was used to close the deep tissue and 2-0 Vicryl was used to close the subcutaneous tissue.  The skin was closed with staples.  An Aquacel dressing was applied.  The patient was taken off the Hana table and taken to the recovery room in stable condition.  Benita Stabile, PA-C did assist during the entire case from beginning to end and his assistance was crucial and medically necessary for soft tissue retraction and management, helping guide implant placement and a layered closure of the wound.

## 2022-06-16 NOTE — Anesthesia Postprocedure Evaluation (Signed)
Anesthesia Post Note  Patient: Stacey Bryant  Procedure(s) Performed: RIGHT TOTAL HIP ARTHROPLASTY (Right: Hip)     Patient location during evaluation: PACU Anesthesia Type: MAC and Spinal Level of consciousness: awake and alert Pain management: pain level controlled Vital Signs Assessment: post-procedure vital signs reviewed and stable Respiratory status: spontaneous breathing, nonlabored ventilation, respiratory function stable and patient connected to nasal cannula oxygen Cardiovascular status: stable and blood pressure returned to baseline Postop Assessment: no apparent nausea or vomiting Anesthetic complications: no   No notable events documented.  Last Vitals:  Vitals:   06/16/22 1230 06/16/22 1315  BP: (!) 106/59 120/69  Pulse: 73 80  Resp: 11   Temp:    SpO2: 99% 100%    Last Pain:  Vitals:   06/16/22 1443  TempSrc:   PainSc: 0-No pain                 Belenda Cruise P Kamron Vanwyhe

## 2022-06-16 NOTE — Anesthesia Procedure Notes (Signed)
Spinal  Patient location during procedure: OR Start time: 06/16/2022 10:11 AM End time: 06/16/2022 10:14 AM Staffing Performed: anesthesiologist  Anesthesiologist: Darral Dash, DO Performed by: Darral Dash, DO Authorized by: Darral Dash, DO   Preanesthetic Checklist Completed: patient identified, IV checked, site marked, risks and benefits discussed, surgical consent, monitors and equipment checked, pre-op evaluation and timeout performed Spinal Block Patient position: sitting Prep: DuraPrep Patient monitoring: heart rate, cardiac monitor, continuous pulse ox and blood pressure Approach: midline Location: L4-5 Injection technique: single-shot Needle Needle type: Pencan  Needle gauge: 24 G Needle length: 10 cm Assessment Events: CSF return Additional Notes Patient identified. Risks/Benefits/Options discussed with patient including but not limited to bleeding, infection, nerve damage, paralysis, failed block, incomplete pain control, headache, blood pressure changes, nausea, vomiting, reactions to medications, itching and postpartum back pain. Confirmed with bedside nurse the patient's most recent platelet count. Confirmed with patient that they are not currently taking any anticoagulation, have any bleeding history or any family history of bleeding disorders. Patient expressed understanding and wished to proceed. All questions were answered. Sterile technique was used throughout the entire procedure. Please see nursing notes for vital signs. Warning signs of high block given to the patient including shortness of breath, tingling/numbness in hands, complete motor block, or any concerning symptoms with instructions to call for help. Patient was given instructions on fall risk and not to get out of bed. All questions and concerns addressed with instructions to call with any issues or inadequate analgesia.

## 2022-06-16 NOTE — Progress Notes (Signed)
Preop labs

## 2022-06-16 NOTE — Transfer of Care (Signed)
Immediate Anesthesia Transfer of Care Note  Patient: Stacey Bryant  Procedure(s) Performed: RIGHT TOTAL HIP ARTHROPLASTY (Right: Hip)  Patient Location: PACU  Anesthesia Type:Spinal  Level of Consciousness: awake, alert , and oriented  Airway & Oxygen Therapy: Patient Spontanous Breathing  Post-op Assessment: Report given to RN, Post -op Vital signs reviewed and stable, and Patient able to stick tongue midline  Post vital signs: Reviewed  Last Vitals:  Vitals Value Taken Time  BP 133/78 06/16/22 1145  Temp 97.8   Pulse 83 06/16/22 1146  Resp 17 06/16/22 1146  SpO2 100 % 06/16/22 1146  Vitals shown include unvalidated device data.  Last Pain:  Vitals:   06/16/22 0812  TempSrc:   PainSc: 0-No pain         Complications: No notable events documented.

## 2022-06-16 NOTE — Evaluation (Signed)
Physical Therapy Evaluation Patient Details Name: Stacey Bryant MRN: [id number] DOB: 1945-08-18 Today's Date: 06/16/2022  History of Present Illness  77 y.o. female presents to [facility] hospital on 06/16/2022 for elective R THA. PMH includes HA, OA, macular degeneration.  Clinical Impression  Pt presents to PT with deficits in functional mobility, gait, balance, strength, power, endurance. Pt is mobilizing well s/p THA, ambulating for short household distances with support of RW. PT provides education on surgical hip HEP handout and encourages frequent mobilization in an effort to improve muscle activation and mobility quality/tolerance. PT recommends discharge home when medically ready. PT will follow up in the morning for further mobility and stair training.       Recommendations for follow up therapy are one component of a multi-disciplinary discharge planning process, led by the attending physician.  Recommendations may be updated based on patient status, additional functional criteria and insurance authorization.  Follow Up Recommendations       Assistance Recommended at Discharge PRN  Patient can return home with the following  A little help with bathing/dressing/bathroom;Assistance with cooking/housework;Assist for transportation;Help with stairs or ramp for entrance    Equipment Recommendations None recommended by PT  Recommendations for Other Services       Functional Status Assessment Patient has had a recent decline in their functional status and demonstrates the ability to make significant improvements in function in a reasonable and predictable amount of time.     Precautions / Restrictions Precautions Precautions: Fall Precaution Comments: direct anterior hip Restrictions Weight Bearing Restrictions: Yes RLE Weight Bearing: Weight bearing as tolerated      Mobility  Bed Mobility Overal bed mobility: Needs Assistance Bed Mobility: Supine to Sit     Supine to sit:  Supervision, HOB elevated     General bed mobility comments: use of railing, increased time    Transfers Overall transfer level: Needs assistance Equipment used: Rolling walker (2 wheels) Transfers: Sit to/from Stand Sit to Stand: Supervision                Ambulation/Gait Ambulation/Gait assistance: Supervision Gait Distance (Feet): 40 Feet Assistive device: Rolling walker (2 wheels) Gait Pattern/deviations: Step-through pattern Gait velocity: reduced Gait velocity interpretation: <1.31 ft/sec, indicative of household ambulator   General Gait Details: slowed step-through gait, reduced stride length  Stairs            Wheelchair Mobility    Modified Rankin (Stroke Patients Only)       Balance Overall balance assessment: Needs assistance Sitting-balance support: No upper extremity supported, Feet supported Sitting balance-Leahy Scale: Good     Standing balance support: Single extremity supported, Reliant on assistive device for balance Standing balance-Leahy Scale: Poor                               Pertinent Vitals/Pain Pain Assessment Pain Assessment: Faces Faces Pain Scale: Hurts a little bit Pain Location: R hip Pain Descriptors / Indicators: Aching Pain Intervention(s): Premedicated before session    Home Living Family/patient expects to be discharged to:: Private residence Living Arrangements: Alone Available Help at Discharge: Family;Available 24 hours/day Type of Home: House Home Access: Stairs to enter Entrance Stairs-Rails: None Entrance Stairs-Number of Steps: 1   Home Layout: One level Home Equipment: Conservation officer, nature (2 wheels);Cane - single point;Grab bars - tub/shower      Prior Function Prior Level of Function : Independent/Modified Independent;Driving  Mobility Comments: ambulatory without DME       Hand Dominance        Extremity/Trunk Assessment   Upper Extremity Assessment Upper  Extremity Assessment: Overall WFL for tasks assessed    Lower Extremity Assessment Lower Extremity Assessment: RLE deficits/detail RLE Deficits / Details: post-op weakness as expected, 4/5 ankle and knee strength, 3+/5 hip flexion, 4-/5 hip extension    Cervical / Trunk Assessment Cervical / Trunk Assessment: Normal  Communication   Communication: No difficulties  Cognition Arousal/Alertness: Awake/alert Behavior During Therapy: WFL for tasks assessed/performed Overall Cognitive Status: Within Functional Limits for tasks assessed                                          General Comments General comments (skin integrity, edema, etc.): VSS on RA    Exercises     Assessment/Plan    PT Assessment Patient needs continued PT services  PT Problem List Decreased strength;Decreased activity tolerance;Decreased balance;Decreased mobility;Pain       PT Treatment Interventions DME instruction;Gait training;Stair training;Functional mobility training;Therapeutic exercise;Therapeutic activities;Balance training;Neuromuscular re-education;Patient/family education    PT Goals (Current goals can be found in the Care Plan section)  Acute Rehab PT Goals Patient Stated Goal: to return to independence PT Goal Formulation: With patient Time For Goal Achievement: 06/20/22 Potential to Achieve Goals: Good    Frequency 7X/week     Co-evaluation               AM-PAC PT "6 Clicks" Mobility  Outcome Measure Help needed turning from your back to your side while in a flat bed without using bedrails?: A Little Help needed moving from lying on your back to sitting on the side of a flat bed without using bedrails?: A Little Help needed moving to and from a bed to a chair (including a wheelchair)?: A Little Help needed standing up from a chair using your arms (e.g., wheelchair or bedside chair)?: A Little Help needed to walk in hospital room?: A Little Help needed climbing  3-5 steps with a railing? : A Little 6 Click Score: 18    End of Session   Activity Tolerance: Patient tolerated treatment well Patient left: in chair;with call bell/phone within reach;with family/visitor present Nurse Communication: Mobility status PT Visit Diagnosis: Other abnormalities of gait and mobility (R26.89);Muscle weakness (generalized) (M62.81);Pain Pain - Right/Left: Right Pain - part of body: Hip    Time: [id number] PT Time Calculation (min) (ACUTE ONLY): 29 min   Charges:   PT Evaluation $PT Eval Low Complexity: 1 Low          Zenaida Niece, PT, DPT Acute Rehabilitation Office [phone]   Zenaida Niece 06/16/2022, 3:43 PM

## 2022-06-16 NOTE — Plan of Care (Signed)

## 2022-06-16 NOTE — TOC Initial Note (Signed)
Transition of Care [facility]) - Initial/Assessment Note    Patient Details  Name: Stacey Bryant MRN: [id number] Date of Birth: December 30, 1945  Transition of Care [facility]) CM/SW Contact:    Pollie Friar, RN Phone Number: 06/16/2022, 4:17 PM  Clinical Narrative:                 Pt is s/p: RIGHT TOTAL HIP ARTHROPLASTY  Pt is from home alone. Pt had [place] pre-arranged from MD office. Information placed on the AVS. TOC following for further d/c needs.   Expected Discharge Plan: [place] Barriers to Discharge: Continued Medical Work up   Patient Goals and CMS Choice            Expected Discharge Plan and Services   Discharge Planning Services: CM Consult   Living arrangements for the past 2 months: [place]: PT, OT [place] Agency: [place]        Prior Living Arrangements/Services Living arrangements for the past 2 months: Single Family Home Lives with:: Self                   Activities of Daily Living      Permission Sought/Granted                  Emotional Assessment              Admission diagnosis:  Status post total replacement of right hip [Z96.641] Patient Active Problem List   Diagnosis Date Noted   Status post total replacement of right hip 06/16/2022   Greater trochanteric pain syndrome 05/28/2021   Low back pain 06/22/2019   Trochanteric bursitis, right hip 02/04/2018   S/P total knee replacement using cement, left 03/30/2017   Pain in right hip 03/17/2017   Unilateral primary osteoarthritis, right hip 03/17/2017   PCP:  Verdell Carmine., MD Pharmacy:   [facility] [phone] [facility] [place], [place] [address] Phone: [phone] Fax: [phone]     Social Determinants of Health (SDOH) Social History: SDOH Screenings   Tobacco Use: Low Risk  (06/16/2022)  Recent Concern: Tobacco Use - High Risk  (03/25/2022)   SDOH Interventions:     Readmission Risk Interventions     No data to display

## 2022-06-17 DIAGNOSIS — M1611 Unilateral primary osteoarthritis, right hip: Secondary | ICD-10-CM | POA: Diagnosis not present

## 2022-06-17 LAB — BASIC METABOLIC PANEL
Anion gap: 9 (ref 5–15)
BUN: 9 mg/dL (ref 8–23)
CO2: 24 mmol/L (ref 22–32)
Calcium: 8.5 mg/dL — ABNORMAL LOW (ref 8.9–10.3)
Chloride: 102 mmol/L (ref 98–111)
Creatinine, Ser: 0.6 mg/dL (ref 0.44–1.00)
GFR, Estimated: >60 mL/min (ref >60)
Glucose, Bld: 130 mg/dL — ABNORMAL HIGH (ref 70–99)
Potassium: 3.6 mmol/L (ref 3.5–5.1)
Sodium: 135 mmol/L (ref 135–145)

## 2022-06-17 LAB — CBC
HCT: 30.2 % — ABNORMAL LOW (ref 36.0–46.0)
Hemoglobin: 10 g/dL — ABNORMAL LOW (ref 12.0–15.0)
MCH: 28.2 pg (ref 26.0–34.0)
MCHC: 33.1 g/dL (ref 30.0–36.0)
MCV: 85.1 fL (ref 80.0–100.0)
Platelets: 214 10*3/uL (ref 150–400)
RBC: 3.55 MIL/uL — ABNORMAL LOW (ref 3.87–5.11)
RDW: 13.8 % (ref 11.5–15.5)
WBC: 8.3 10*3/uL (ref 4.0–10.5)
nRBC: 0 % (ref 0.0–0.2)

## 2022-06-17 MED ORDER — OXYCODONE HCL 5 MG PO TABS: 5.0000 mg | ORAL_TABLET | Freq: Four times a day (QID) | ORAL | 0 refills | Status: DC | PRN

## 2022-06-17 MED ORDER — ASPIRIN 81 MG PO CHEW: 81.0000 mg | CHEWABLE_TABLET | Freq: Two times a day (BID) | ORAL | 0 refills | Status: AC

## 2022-06-17 MED ORDER — METHOCARBAMOL 500 MG PO TABS: 500.0000 mg | ORAL_TABLET | Freq: Four times a day (QID) | ORAL | 1 refills | Status: AC | PRN

## 2022-06-17 NOTE — TOC Progression Note (Signed)
Transition of Care [facility]) - Progression Note    Patient Details  Name: Stacey Bryant MRN: [id number] Date of Birth: [birth year]  Transition of Care [facility]) CM/SW Contact  Sharin Mons, RN Phone Number: 06/17/2022, 12:32 PM  Clinical Narrative:     NCM f/u with pt regarding disposition to home with home health services. Pt states hoping to d/c to home today. States daughters to assist with care once d/c. Pt without DME needs. States already has RW, high toilet, grab bars @ home . Decline 3in1 / BSC. Home health services will be provided by [facility] [place] [facility] within 48 hours post d/c. Pt without RX med concerns. Daughter to provide transportation to home.  TOC  team following ....  Expected Discharge Plan: [place] Barriers to Discharge: Continued Medical Work up  Expected Discharge Plan and Services   Discharge Planning Services: CM Consult   Living arrangements for the past 2 months: Single Family Home Expected Discharge Date: 06/17/22                         HH Arranged: PT, OT HH Agency: [place]         Social Determinants of Health (SDOH) Interventions SDOH Screenings   Tobacco Use: Low Risk  (06/16/2022)  Recent Concern: Tobacco Use - High Risk (03/25/2022)    Readmission Risk Interventions     No data to display

## 2022-06-17 NOTE — Discharge Summary (Signed)
Patient ID: Stacey Bryant MRN: [id number] DOB/AGE: Jun 05, 1945 77 y.o.  Admit date: 06/16/2022 Discharge date: 06/17/2022  Admission Diagnoses:  Principal Problem:   Unilateral primary osteoarthritis, right hip Active Problems:   Status post total replacement of right hip   Discharge Diagnoses:  Same  Past Medical History:  Diagnosis Date   Arthritis    Headache    Hx migraines with periods   Macular degeneration     Surgeries: Procedure(s): RIGHT TOTAL HIP ARTHROPLASTY on 06/16/2022   Consultants:   Discharged Condition: Improved  Hospital Course: Zellah Blansett is an 77 y.o. female who was admitted 06/16/2022 for operative treatment ofUnilateral primary osteoarthritis, right hip. Patient has severe unremitting pain that affects sleep, daily activities, and work/hobbies. After pre-op clearance the patient was taken to the operating room on 06/16/2022 and underwent  Procedure(s): RIGHT TOTAL HIP ARTHROPLASTY.    Patient was given perioperative antibiotics:  Anti-infectives (From admission, onward)    Start     Dose/Rate Route Frequency Ordered Stop   06/16/22 1600  ceFAZolin (ANCEF) IVPB 1 g/50 mL premix        1 g 100 mL/hr over 30 Minutes Intravenous Every 6 hours 06/16/22 1313 06/17/22 0254   06/16/22 0830  ceFAZolin (ANCEF) IVPB 2g/100 mL premix        2 g 200 mL/hr over 30 Minutes Intravenous On call to O.R. 06/16/22 0745 06/16/22 1019        Patient was given sequential compression devices, early ambulation, and chemoprophylaxis to prevent DVT.  Patient benefited maximally from hospital stay and there were no complications.    Recent vital signs: Patient Vitals for the past 24 hrs:  BP Temp Pulse Resp SpO2  06/17/22 0842 (!) 106/55 -- (!) 103 -- 95 %  06/17/22 0435 (!) 126/54 98.1 F (36.7 C) 96 17 92 %  06/17/22 0006 (!) 124/45 98.4 F (36.9 C) 94 17 97 %  06/16/22 2003 (!) 129/54 98.4 F (36.9 C) 87 17 98 %  06/16/22 1608 90/73 -- 75 16 100 %  06/16/22 1315  120/69 -- 80 -- 100 %  06/16/22 1314 -- -- -- -- 94 %     Recent laboratory studies:  Recent Labs    06/17/22 0359  WBC 8.3  HGB 10.0*  HCT 30.2*  PLT 214  NA 135  K 3.6  CL 102  CO2 24  BUN 9  CREATININE 0.60  GLUCOSE 130*  CALCIUM 8.5*     Discharge Medications:   Allergies as of 06/17/2022       Reactions   Alendronate Other (See Comments)   Penicillin G Rash   Has patient had a PCN reaction causing immediate rash, facial/tongue/throat swelling, SOB or lightheadedness with hypotension: No Has patient had a PCN reaction causing severe rash involving mucus membranes or skin necrosis: No Has patient had a PCN reaction that required hospitalization: No Has patient had a PCN reaction occurring within the last 10 years: No If all of the above answers are "NO", then may proceed with Cephalosporin use.        Medication List     TAKE these medications    acetaminophen 650 MG CR tablet Commonly known as: TYLENOL Take 1,300 mg by mouth every 8 (eight) hours as needed for pain.   aspirin 81 MG chewable tablet Chew 1 tablet (81 mg total) by mouth 2 (two) times daily.   carboxymethylcellulose 0.5 % Soln Commonly known as: REFRESH PLUS Place 1 drop into both eyes  daily as needed (dry eyes).   cholecalciferol 1000 units tablet Commonly known as: VITAMIN D Take 1,000 Units by mouth daily.   methocarbamol 500 MG tablet Commonly known as: ROBAXIN Take 1 tablet (500 mg total) by mouth every 6 (six) hours as needed for muscle spasms.   oxyCODONE 5 MG immediate release tablet Commonly known as: Oxy IR/ROXICODONE Take 1-2 tablets (5-10 mg total) by mouth every 6 (six) hours as needed for moderate pain (pain score 4-6).   PRESERVISION AREDS 2+MULTI VIT PO Take 1 capsule by mouth in the morning and at bedtime.   triamcinolone cream 0.1 % Commonly known as: KENALOG Apply 1 Application topically daily as needed (eczema).               Durable Medical Equipment   (From admission, onward)           Start     Ordered   06/16/22 1314  DME 3 n 1  Once        06/16/22 1313   06/16/22 1314  DME Walker rolling  Once       Question Answer Comment  Walker: With 5 Inch Wheels   Patient needs a walker to treat with the following condition Status post total replacement of right hip      06/16/22 1313            Diagnostic Studies: DG Pelvis Portable  Result Date: 06/16/2022 CLINICAL DATA:  Hip replacement EXAM: PORTABLE PELVIS 1-2 VIEWS COMPARISON:  Hip radiograph 05/28/21 FINDINGS: Postsurgical changes from recent right hip arthroplasty. Small amount of subcutaneous gas surrounding the right hip joint. No evidence of periprosthetic fracture. Normal alignment. Assessment of the sacrum is limited due to overlying bowel gas. IMPRESSION: Expected postsurgical changes from recent right hip arthroplasty. Electronically Signed   By: Marin Roberts M.D.   On: 06/16/2022 12:58   DG HIP UNILAT WITH PELVIS 1V RIGHT  Result Date: 06/16/2022 CLINICAL DATA:  Total right hip arthroplasty. EXAM: DG HIP (WITH OR WITHOUT PELVIS) 1V RIGHT COMPARISON:  Right hip radiographs 05/28/2021 FINDINGS: Images were performed intraoperatively without the presence of a radiologist. Interval total right hip arthroplasty. No hardware complication is seen. Total fluoroscopy images: 3 Total fluoroscopy time: 17 seconds Total dose: Radiation Exposure Index (as provided by the fluoroscopic device): 1.76 mGy air Kerma Please see intraoperative findings for further detail. IMPRESSION: Intraoperative fluoroscopic guidance for total right hip arthroplasty. Electronically Signed   By: Yvonne Kendall M.D.   On: 06/16/2022 11:28   DG C-Arm 1-60 Min-No Report  Result Date: 06/16/2022 Fluoroscopy was utilized by the requesting physician.  No radiographic interpretation.    Disposition: Discharge disposition: 01-Home or [place], [place]  Follow up.   Specialty: Home Health Services Why: The home health agency will contact you for the first home visit. Contact information: [place] [address] [phone]         Mcarthur Rossetti, MD Follow up in 2 week(s).   Specialty: Orthopedic Surgery Contact information: [address] [place] [address] [phone]                  Signed: Mcarthur Rossetti 06/17/2022, 12:31 PM

## 2022-06-17 NOTE — Progress Notes (Signed)
Physical Therapy Treatment Patient Details Name: Stacey Bryant MRN: [id number] DOB: January 20, 1946 Today's Date: 06/17/2022   History of Present Illness 77 y.o. female presents to [facility] hospital on 06/16/2022 for elective R THA. PMH includes HA, OA, macular degeneration.    PT Comments    Pt was received in supine and agreeable to session. Pt reported increased pain upon standing due to lack of mobility since yesterday. Pt demonstrated difficulty with standing due to pain, requiring increased time and cues for technique but no physical assist. Pt reported decreased stiffness and pain during gait trial. Pt was able to increase gait distance and perform safe stair trial this session with cues for technique and up to min guard for safety. Discussed navigating home set up and simulated getting into a tall bed with gait belt to elevate RLE. Pt reports having 24/7 supervision and assist from daughters at discharge. Anticipate pt and family will be able to manage pt's mobility needs at home.   Recommendations for follow up therapy are one component of a multi-disciplinary discharge planning process, led by the attending physician.  Recommendations may be updated based on patient status, additional functional criteria and insurance authorization.  Follow Up Recommendations       Assistance Recommended at Discharge PRN  Patient can return home with the following A little help with bathing/dressing/bathroom;Assistance with cooking/housework;Assist for transportation;Help with stairs or ramp for entrance   Equipment Recommendations  None recommended by PT    Recommendations for Other Services       Precautions / Restrictions Precautions Precautions: Fall Precaution Comments: direct anterior hip Restrictions Weight Bearing Restrictions: Yes RLE Weight Bearing: Weight bearing as tolerated     Mobility  Bed Mobility Overal bed mobility: Needs Assistance Bed Mobility: Supine to Sit, Sit to Supine      Supine to sit: Supervision, HOB elevated Sit to supine: Min assist   General bed mobility comments: Increased time and min A for RLE elevation to EOB. Pt instructed in use of gait belt to lift RLE. Simulated getting into a tall bed like pt's home environment.    Transfers Overall transfer level: Needs assistance Equipment used: Rolling walker (2 wheels) Transfers: Sit to/from Stand Sit to Stand: Min guard, Supervision           General transfer comment: from low EOBx2 and mat table x1 with min guard for lower surfaces and supervision for slightly elevated surfaces. Cues for safe hand placement and placing RLE forward for decreased pain.    Ambulation/Gait Ambulation/Gait assistance: Supervision, Min guard Gait Distance (Feet): 130 Feet Assistive device: Rolling walker (2 wheels) Gait Pattern/deviations: Step-through pattern, Antalgic, Trunk flexed, Decreased stance time - right Gait velocity: reduced     General Gait Details: slow step-through pattern with cues for upright posture. Antalgic pattern improved with increased distance due to decreased stiffness and pain per pt report. Progressed from min guard to supervision.   Stairs Stairs: Yes Stairs assistance: Min guard Stair Management: Two rails Number of Stairs: 2 General stair comments: Pt demonstrating safe stair trial with no evidence of unsteadiness. Cues for technique and sequencing. min guard for safety      Balance Overall balance assessment: Needs assistance Sitting-balance support: No upper extremity supported, Feet supported Sitting balance-Leahy Scale: Good Sitting balance - Comments: sitting EOB   Standing balance support: Reliant on assistive device for balance, Bilateral upper extremity supported, During functional activity Standing balance-Leahy Scale: Poor Standing balance comment: with RW support  Cognition Arousal/Alertness: Awake/alert Behavior During  Therapy: WFL for tasks assessed/performed Overall Cognitive Status: Within Functional Limits for tasks assessed                                          Exercises      General Comments        Pertinent Vitals/Pain Pain Assessment Pain Assessment: 0-10 Pain Score: 4  Pain Location: R hip Pain Descriptors / Indicators: Aching, Guarding, Grimacing Pain Intervention(s): Limited activity within patient's tolerance, Monitored during session, Repositioned     PT Goals (current goals can now be found in the care plan section) Acute Rehab PT Goals Patient Stated Goal: to return to independence PT Goal Formulation: With patient Time For Goal Achievement: 06/20/22 Potential to Achieve Goals: Good Progress towards PT goals: Progressing toward goals    Frequency    7X/week      PT Plan Current plan remains appropriate       AM-PAC PT "6 Clicks" Mobility   Outcome Measure  Help needed turning from your back to your side while in a flat bed without using bedrails?: None Help needed moving from lying on your back to sitting on the side of a flat bed without using bedrails?: A Little Help needed moving to and from a bed to a chair (including a wheelchair)?: A Little Help needed standing up from a chair using your arms (e.g., wheelchair or bedside chair)?: A Little Help needed to walk in hospital room?: A Little Help needed climbing 3-5 steps with a railing? : A Little 6 Click Score: 19    End of Session Equipment Utilized During Treatment: Gait belt Activity Tolerance: Patient tolerated treatment well Patient left: in chair;with call bell/phone within reach Nurse Communication: Mobility status PT Visit Diagnosis: Other abnormalities of gait and mobility (R26.89);Muscle weakness (generalized) (M62.81);Pain     Time: [id number] PT Time Calculation (min) (ACUTE ONLY): 38 min  Charges:  $Gait Training: 23-37 mins $Therapeutic Activity: 8-22 mins                      Michelle Nasuti, PTA Acute Rehabilitation Services Secure Chat Preferred  [phone]    Michelle Nasuti 06/17/2022, 9:27 AM

## 2022-06-17 NOTE — Discharge Instructions (Signed)

## 2022-06-17 NOTE — Progress Notes (Signed)
Subjective: 1 Day Post-Op Procedure(s) (LRB): RIGHT TOTAL HIP ARTHROPLASTY (Right) Patient reports pain as moderate.  Slight acute blood loss anemia from surgery, but asymptomatic and stable vitals.  Objective: Vital signs in last 24 hours: Temp:  [97.5 F (36.4 C)-98.4 F (36.9 C)] 98.1 F (36.7 C) (04/03 0435) Pulse Rate:  [69-96] 96 (04/03 0435) Resp:  [11-18] 17 (04/03 0435) BP: (90-151)/(45-78) 126/54 (04/03 0435) SpO2:  [92 %-100 %] 92 % (04/03 0435) Weight:  [75.8 kg] 75.8 kg (04/02 0754)  Intake/Output from previous day: 04/02 0701 - 04/03 0700 In: 2061.7 [I.V.:1764.7; IV Piggyback:296.9] Out: 1500 [Urine:1350; Blood:150] Intake/Output this shift: No intake/output data recorded.  Recent Labs    06/17/22 0359  HGB 10.0*   Recent Labs    06/17/22 0359  WBC 8.3  RBC 3.55*  HCT 30.2*  PLT 214   Recent Labs    06/17/22 0359  NA 135  K 3.6  CL 102  CO2 24  BUN 9  CREATININE 0.60  GLUCOSE 130*  CALCIUM 8.5*   No results for input(s): "LABPT", "INR" in the last 72 hours.  Sensation intact distally Intact pulses distally Dorsiflexion/Plantar flexion intact Incision: dressing C/D/I   Assessment/Plan: 1 Day Post-Op Procedure(s) (LRB): RIGHT TOTAL HIP ARTHROPLASTY (Right) Up with therapy      Stacey Bryant 06/17/2022, 7:46 AM

## 2022-06-18 ENCOUNTER — Encounter (HOSPITAL_COMMUNITY): Payer: Self-pay | Admitting: Orthopaedic Surgery

## 2022-06-18 NOTE — OR Nursing (Signed)
Addendum created.  Incorrect Vendor placed in chart.

## 2022-06-23 ENCOUNTER — Encounter: Payer: Self-pay | Admitting: Orthopaedic Surgery

## 2022-06-29 ENCOUNTER — Ambulatory Visit (INDEPENDENT_AMBULATORY_CARE_PROVIDER_SITE_OTHER): Payer: Medicare Other | Admitting: Orthopaedic Surgery

## 2022-06-29 DIAGNOSIS — Z96641 Presence of right artificial hip joint: Secondary | ICD-10-CM

## 2022-06-29 MED ORDER — TRAMADOL HCL 50 MG PO TABS: 100.0000 mg | ORAL_TABLET | Freq: Four times a day (QID) | ORAL | 0 refills | Status: AC | PRN

## 2022-06-29 NOTE — Progress Notes (Signed)
The patient is here today for first visit status post a right total hip replacement.  She is doing great overall.  She is ambulating with a cane today.  She is requesting only trying some tramadol for pain and methocarbamol has been helping.  She has been compliant with a baby aspirin twice daily.  Her right hip incision looks good.  The staples have been removed and Steri-Strips applied.  She does have some right knee pain and swelling with known arthritis in the right knee and some of this is definitely related to the surgery performed twisting her knee around.  Her calf is soft.  She has been compliant with wearing compressive hose.  She understands if she develops foot and ankle swelling that she should continue the compressive hose.  She can stop her twice daily aspirin.  I did send in some tramadol for her.  Will see her back in a month to see how she is doing overall but no x-rays are needed.  All questions and concerns were addressed and answered.

## 2022-07-29 ENCOUNTER — Encounter: Payer: Self-pay | Admitting: Orthopaedic Surgery

## 2022-07-29 ENCOUNTER — Ambulatory Visit (INDEPENDENT_AMBULATORY_CARE_PROVIDER_SITE_OTHER): Payer: Medicare Other | Admitting: Orthopaedic Surgery

## 2022-07-29 DIAGNOSIS — M25561 Pain in right knee: Secondary | ICD-10-CM | POA: Diagnosis not present

## 2022-07-29 DIAGNOSIS — G8929 Other chronic pain: Secondary | ICD-10-CM | POA: Diagnosis not present

## 2022-07-29 DIAGNOSIS — Z96641 Presence of right artificial hip joint: Secondary | ICD-10-CM

## 2022-07-29 MED ORDER — METHYLPREDNISOLONE ACETATE 40 MG/ML IJ SUSP
40.0000 mg | INTRAMUSCULAR | Status: AC | PRN
Start: 2022-07-29 — End: 2022-07-29
  Administered 2022-07-29: 40 mg via INTRA_ARTICULAR

## 2022-07-29 MED ORDER — LIDOCAINE HCL 1 % IJ SOLN
3.0000 mL | INTRAMUSCULAR | Status: AC | PRN
Start: 2022-07-29 — End: 2022-07-29
  Administered 2022-07-29: 3 mL

## 2022-07-29 NOTE — Progress Notes (Signed)
The patient is now 6 weeks post a right total hip replacement.  She has remote history of a left knee replacement.  Her right knee has been hurting her quite a bit.  She has well-documented arthritis in that right knee.  Her previous x-rays from a few years ago shows valgus malalignment with bone-on-bone wear of the lateral compartment and the patellofemoral joint.  The bone is also osteopenic.  She says she is doing great with her right hip replacement.  She is 77 years old.  She is walking without any assistive device.  She is requesting at least a steroid injection in her right knee today.  She is not interested thus far and right knee replacement surgery.  Her right operative hip moves smoothly and fluidly.  The right knee has maybe a mild effusion but there is definitely patellofemoral crepitation and lateral joint line tenderness with valgus malalignment.  We did place a steroid injection in the right knee today without difficulty.  Really the next time we need to see her back is now for 6 months unless there are issues.  Will have a standing low AP pelvis and lateral of her right operative hip at that visit.  If there are issues before then she knows to let us know.     Procedure Note  Patient: Stacey Bryant             Date of Birth: [birth year]           MRN: [id number]             Visit Date: 07/29/2022  Procedures: Visit Diagnoses:  1. Status post total replacement of right hip   2. Chronic pain of right knee     Large Joint Inj: R knee on 07/29/2022 1:53 PM Indications: diagnostic evaluation and pain Details: 22 G 1.5 in needle, superolateral approach  Arthrogram: No  Medications: 3 mL lidocaine 1 %; 40 mg methylPREDNISolone acetate 40 MG/ML Outcome: tolerated well, no immediate complications Procedure, treatment alternatives, risks and benefits explained, specific risks discussed. Consent was given by the patient. Immediately prior to procedure a time out was called to verify the  correct patient, procedure, equipment, support staff and site/side marked as required. Patient was prepped and draped in the usual sterile fashion.

## 2022-08-12 ENCOUNTER — Other Ambulatory Visit: Payer: Self-pay

## 2022-08-12 MED ORDER — CLINDAMYCIN HCL 150 MG PO CAPS: ORAL_CAPSULE | ORAL | 0 refills | Status: AC

## 2023-01-28 ENCOUNTER — Other Ambulatory Visit (INDEPENDENT_AMBULATORY_CARE_PROVIDER_SITE_OTHER): Payer: Medicare Other

## 2023-01-28 ENCOUNTER — Ambulatory Visit: Payer: Medicare Other | Admitting: Orthopaedic Surgery

## 2023-01-28 ENCOUNTER — Encounter: Payer: Self-pay | Admitting: Orthopaedic Surgery

## 2023-01-28 DIAGNOSIS — Z96641 Presence of right artificial hip joint: Secondary | ICD-10-CM

## 2023-01-28 NOTE — Progress Notes (Signed)
The patient is well-known to me.  She is now about 7 months out from a right total hip arthroplasty.  She is very active 77 years old.  She also has debilitating arthritis involving her right knee but that is stable right now.  We did place a steroid injection in the right knee in May and she said that is still doing well for her.  She has a remote history of a left knee replacement about 5 or so years ago.  She says her right hip is doing well but she does get occasional almost shock type of feelings in the hip that goes away quickly.  Its on the lateral aspect and she still has some numbness in that area as well.  The right hip does move smoothly and fluidly.  Her left hip move smoothly and fluidly.  She is walking without an assistive device and no significant limp.  An AP pelvis and lateral the right hip shows a well-seated right total hip arthroplasty with no complicating features.  The components are in good position.  At this point follow-up for hip can be as needed.  She understands that she can come in anytime for an injection in her right knee or even consider right knee replacement surgery.  If the hip has any issues at all she also knows to reach out to [place].

## 2023-09-11 ENCOUNTER — Encounter (HOSPITAL_COMMUNITY): Payer: Self-pay | Admitting: Interventional Radiology

## 2024-01-17 ENCOUNTER — Encounter: Payer: Self-pay | Admitting: Radiology
